# Patient Record
Sex: Female | Born: 1945
Health system: Southern US, Community
[De-identification: ages and names within clinical notes are randomized; demographics above are authoritative.]

## PROBLEM LIST (undated history)

## (undated) DIAGNOSIS — E119 Type 2 diabetes mellitus without complications: Secondary | ICD-10-CM

## (undated) DIAGNOSIS — L821 Other seborrheic keratosis: Secondary | ICD-10-CM

## (undated) DIAGNOSIS — G5600 Carpal tunnel syndrome, unspecified upper limb: Secondary | ICD-10-CM

## (undated) DIAGNOSIS — I10 Essential (primary) hypertension: Secondary | ICD-10-CM

## (undated) DIAGNOSIS — E785 Hyperlipidemia, unspecified: Secondary | ICD-10-CM

## (undated) DIAGNOSIS — L900 Lichen sclerosus et atrophicus: Secondary | ICD-10-CM

## (undated) DIAGNOSIS — L570 Actinic keratosis: Secondary | ICD-10-CM

## (undated) DIAGNOSIS — C4491 Basal cell carcinoma of skin, unspecified: Secondary | ICD-10-CM

## (undated) DIAGNOSIS — H2513 Age-related nuclear cataract, bilateral: Secondary | ICD-10-CM

## (undated) HISTORY — DX: Lichen sclerosus et atrophicus: L90.0

## (undated) HISTORY — DX: Essential (primary) hypertension: I10

## (undated) HISTORY — DX: Age-related nuclear cataract, bilateral: H25.13

## (undated) HISTORY — DX: Actinic keratosis: L57.0

## (undated) HISTORY — DX: Carpal tunnel syndrome, unspecified upper limb: G56.00

## (undated) HISTORY — PX: APPENDECTOMY: SHX54

## (undated) HISTORY — DX: Other seborrheic keratosis: L82.1

## (undated) HISTORY — DX: Basal cell carcinoma of skin, unspecified: C44.91

## (undated) HISTORY — DX: Hyperlipidemia, unspecified: E78.5

## (undated) HISTORY — DX: Type 2 diabetes mellitus without complications: E11.9

---

## 2011-08-22 DIAGNOSIS — R7309 Other abnormal glucose: Secondary | ICD-10-CM | POA: Diagnosis not present

## 2011-08-22 DIAGNOSIS — Z139 Encounter for screening, unspecified: Secondary | ICD-10-CM | POA: Diagnosis not present

## 2011-08-22 DIAGNOSIS — B372 Candidiasis of skin and nail: Secondary | ICD-10-CM | POA: Diagnosis not present

## 2011-08-22 DIAGNOSIS — E785 Hyperlipidemia, unspecified: Secondary | ICD-10-CM | POA: Diagnosis not present

## 2011-08-22 DIAGNOSIS — R030 Elevated blood-pressure reading, without diagnosis of hypertension: Secondary | ICD-10-CM | POA: Diagnosis not present

## 2011-11-30 DIAGNOSIS — Z139 Encounter for screening, unspecified: Secondary | ICD-10-CM | POA: Diagnosis not present

## 2011-11-30 DIAGNOSIS — E785 Hyperlipidemia, unspecified: Secondary | ICD-10-CM | POA: Diagnosis not present

## 2011-11-30 DIAGNOSIS — R7309 Other abnormal glucose: Secondary | ICD-10-CM | POA: Diagnosis not present

## 2011-12-05 DIAGNOSIS — R030 Elevated blood-pressure reading, without diagnosis of hypertension: Secondary | ICD-10-CM | POA: Diagnosis not present

## 2011-12-05 DIAGNOSIS — E669 Obesity, unspecified: Secondary | ICD-10-CM | POA: Diagnosis not present

## 2011-12-05 DIAGNOSIS — E785 Hyperlipidemia, unspecified: Secondary | ICD-10-CM | POA: Diagnosis not present

## 2011-12-05 DIAGNOSIS — E119 Type 2 diabetes mellitus without complications: Secondary | ICD-10-CM | POA: Diagnosis not present

## 2012-01-18 DIAGNOSIS — N760 Acute vaginitis: Secondary | ICD-10-CM | POA: Diagnosis not present

## 2012-01-18 DIAGNOSIS — N390 Urinary tract infection, site not specified: Secondary | ICD-10-CM | POA: Diagnosis not present

## 2012-03-06 DIAGNOSIS — Z23 Encounter for immunization: Secondary | ICD-10-CM | POA: Diagnosis not present

## 2012-03-26 DIAGNOSIS — E119 Type 2 diabetes mellitus without complications: Secondary | ICD-10-CM | POA: Diagnosis not present

## 2012-03-26 DIAGNOSIS — Z23 Encounter for immunization: Secondary | ICD-10-CM | POA: Diagnosis not present

## 2012-03-26 DIAGNOSIS — Z86010 Personal history of colonic polyps: Secondary | ICD-10-CM | POA: Diagnosis not present

## 2012-03-26 DIAGNOSIS — I10 Essential (primary) hypertension: Secondary | ICD-10-CM | POA: Diagnosis not present

## 2012-03-26 DIAGNOSIS — E785 Hyperlipidemia, unspecified: Secondary | ICD-10-CM | POA: Diagnosis not present

## 2012-03-26 DIAGNOSIS — Z139 Encounter for screening, unspecified: Secondary | ICD-10-CM | POA: Diagnosis not present

## 2012-04-26 DIAGNOSIS — L570 Actinic keratosis: Secondary | ICD-10-CM | POA: Diagnosis not present

## 2012-04-26 DIAGNOSIS — D239 Other benign neoplasm of skin, unspecified: Secondary | ICD-10-CM | POA: Diagnosis not present

## 2012-04-26 DIAGNOSIS — B079 Viral wart, unspecified: Secondary | ICD-10-CM | POA: Diagnosis not present

## 2012-04-26 DIAGNOSIS — D485 Neoplasm of uncertain behavior of skin: Secondary | ICD-10-CM | POA: Diagnosis not present

## 2012-05-09 DIAGNOSIS — H02839 Dermatochalasis of unspecified eye, unspecified eyelid: Secondary | ICD-10-CM | POA: Diagnosis not present

## 2012-05-09 DIAGNOSIS — E119 Type 2 diabetes mellitus without complications: Secondary | ICD-10-CM | POA: Diagnosis not present

## 2012-05-09 DIAGNOSIS — H2510 Age-related nuclear cataract, unspecified eye: Secondary | ICD-10-CM | POA: Diagnosis not present

## 2012-05-09 DIAGNOSIS — H524 Presbyopia: Secondary | ICD-10-CM | POA: Diagnosis not present

## 2012-06-28 DIAGNOSIS — Z1231 Encounter for screening mammogram for malignant neoplasm of breast: Secondary | ICD-10-CM | POA: Diagnosis not present

## 2012-09-17 DIAGNOSIS — E785 Hyperlipidemia, unspecified: Secondary | ICD-10-CM | POA: Diagnosis not present

## 2012-09-17 DIAGNOSIS — E119 Type 2 diabetes mellitus without complications: Secondary | ICD-10-CM | POA: Diagnosis not present

## 2012-09-17 DIAGNOSIS — Z139 Encounter for screening, unspecified: Secondary | ICD-10-CM | POA: Diagnosis not present

## 2012-09-18 DIAGNOSIS — Z124 Encounter for screening for malignant neoplasm of cervix: Secondary | ICD-10-CM | POA: Diagnosis not present

## 2012-09-18 DIAGNOSIS — Z01419 Encounter for gynecological examination (general) (routine) without abnormal findings: Secondary | ICD-10-CM | POA: Diagnosis not present

## 2012-09-20 DIAGNOSIS — E119 Type 2 diabetes mellitus without complications: Secondary | ICD-10-CM | POA: Diagnosis not present

## 2012-09-20 DIAGNOSIS — Z1211 Encounter for screening for malignant neoplasm of colon: Secondary | ICD-10-CM | POA: Diagnosis not present

## 2012-09-20 DIAGNOSIS — E785 Hyperlipidemia, unspecified: Secondary | ICD-10-CM | POA: Diagnosis not present

## 2012-09-20 DIAGNOSIS — I10 Essential (primary) hypertension: Secondary | ICD-10-CM | POA: Diagnosis not present

## 2012-09-20 DIAGNOSIS — Z86010 Personal history of colonic polyps: Secondary | ICD-10-CM | POA: Diagnosis not present

## 2013-01-01 DIAGNOSIS — Z8371 Family history of colonic polyps: Secondary | ICD-10-CM | POA: Diagnosis not present

## 2013-01-01 DIAGNOSIS — Z860100 Personal history of colon polyps, unspecified: Secondary | ICD-10-CM | POA: Diagnosis not present

## 2013-01-01 DIAGNOSIS — Z800 Family history of malignant neoplasm of digestive organs: Secondary | ICD-10-CM | POA: Diagnosis not present

## 2013-01-01 DIAGNOSIS — Z1211 Encounter for screening for malignant neoplasm of colon: Secondary | ICD-10-CM | POA: Diagnosis not present

## 2013-01-15 DIAGNOSIS — I10 Essential (primary) hypertension: Secondary | ICD-10-CM | POA: Diagnosis not present

## 2013-01-15 DIAGNOSIS — E785 Hyperlipidemia, unspecified: Secondary | ICD-10-CM | POA: Diagnosis not present

## 2013-01-15 DIAGNOSIS — E119 Type 2 diabetes mellitus without complications: Secondary | ICD-10-CM | POA: Diagnosis not present

## 2013-03-04 ENCOUNTER — Encounter: Payer: Self-pay | Admitting: Family Medicine

## 2013-03-04 ENCOUNTER — Ambulatory Visit (INDEPENDENT_AMBULATORY_CARE_PROVIDER_SITE_OTHER): Payer: Medicare Other | Admitting: Family Medicine

## 2013-03-04 VITALS — BP 123/77 | HR 69 | Temp 97.7°F | Ht 63.0 in | Wt 189.0 lb

## 2013-03-04 DIAGNOSIS — E785 Hyperlipidemia, unspecified: Secondary | ICD-10-CM | POA: Insufficient documentation

## 2013-03-04 DIAGNOSIS — H9190 Unspecified hearing loss, unspecified ear: Secondary | ICD-10-CM | POA: Diagnosis not present

## 2013-03-04 DIAGNOSIS — H9192 Unspecified hearing loss, left ear: Secondary | ICD-10-CM

## 2013-03-04 DIAGNOSIS — H9193 Unspecified hearing loss, bilateral: Secondary | ICD-10-CM

## 2013-03-04 NOTE — Progress Notes (Signed)
  Subjective:    Patient ID: Jacqueline Clark, female    DOB: February 25, 1946, 67 y.o.   MRN: [id number]  HPI This 67 y.o. female presents for evaluation of establishment.  She has hx of HOH and needs a referral To ENT.  She has hx of diabetes.  She has been having difficulty with her hearing and she Needs a referral to [facility].   Review of Systems C/o decreased hearing acuity No chest pain, SOB, HA, dizziness, vision change, N/V, diarrhea, constipation, dysuria, urinary urgency or frequency, myalgias, arthralgias or rash.     Objective:   Physical Exam Vital signs noted  Well developed well nourished female.  HEENT - Head atraumatic Normocephalic                Eyes - PERRLA, Conjuctiva - clear Sclera- Clear EOMI                Ears - EAC's Wnl TM's Wnl Gross Hearing decreased bilateral Respiratory - Lungs CTA bilateral Cardiac - RRR S1 and S2 without murmur GI - Abdomen soft Nontender and bowel sounds active x 4 Extremities - No edema. Neuro - Grossly intact.       Assessment & Plan:  Hearing loss, left - Plan: Ambulatory referral to ENT  Hearing decreased, bilateral - Refer to ENT  Other and unspecified hyperlipidemia - Continue simvastatin  Follow up in 3 months  Deatra Canter FNP

## 2013-03-04 NOTE — Patient Instructions (Addendum)
Hypertriglyceridemia  Diet for High blood levels of Triglycerides Most fats in food are triglycerides. Triglycerides in your blood are stored as fat in your body. High levels of triglycerides in your blood may put you at a greater risk for heart disease and stroke.  Normal triglyceride levels are less than 150 mg/dL. Borderline high levels are 150-199 mg/dl. High levels are 200 - 499 mg/dL, and very high triglyceride levels are greater than 500 mg/dL. The decision to treat high triglycerides is generally based on the level. For people with borderline or high triglyceride levels, treatment includes weight loss and exercise. Drugs are recommended for people with very high triglyceride levels. Many people who need treatment for high triglyceride levels have metabolic syndrome. This syndrome is a collection of disorders that often include: insulin resistance, high blood pressure, blood clotting problems, high cholesterol and triglycerides. TESTING PROCEDURE FOR TRIGLYCERIDES  You should not eat 4 hours before getting your triglycerides measured. The normal range of triglycerides is between 10 and 250 milligrams per deciliter (mg/dl). Some people may have extreme levels (1000 or above), but your triglyceride level may be too high if it is above 150 mg/dl, depending on what other risk factors you have for heart disease.  People with high blood triglycerides may also have high blood cholesterol levels. If you have high blood cholesterol as well as high blood triglycerides, your risk for heart disease is probably greater than if you only had high triglycerides. High blood cholesterol is one of the main risk factors for heart disease. CHANGING YOUR DIET  Your weight can affect your blood triglyceride level. If you are more than 20% above your ideal body weight, you may be able to lower your blood triglycerides by losing weight. Eating less and exercising regularly is the best way to combat this. Fat provides more  calories than any other food. The best way to lose weight is to eat less fat. Only 30% of your total calories should come from fat. Less than 7% of your diet should come from saturated fat. A diet low in fat and saturated fat is the same as a diet to decrease blood cholesterol. By eating a diet lower in fat, you may lose weight, lower your blood cholesterol, and lower your blood triglyceride level.  Eating a diet low in fat, especially saturated fat, may also help you lower your blood triglyceride level. Ask your dietitian to help you figure how much fat you can eat based on the number of calories your caregiver has prescribed for you.  Exercise, in addition to helping with weight loss may also help lower triglyceride levels.   Alcohol can increase blood triglycerides. You may need to stop drinking alcoholic beverages.  Too much carbohydrate in your diet may also increase your blood triglycerides. Some complex carbohydrates are necessary in your diet. These may include bread, rice, potatoes, other starchy vegetables and cereals.  Reduce "simple" carbohydrates. These may include pure sugars, candy, honey, and jelly without losing other nutrients. If you have the kind of high blood triglycerides that is affected by the amount of carbohydrates in your diet, you will need to eat less sugar and less high-sugar foods. Your caregiver can help you with this.  Adding 2-4 grams of fish oil (EPA+ DHA) may also help lower triglycerides. Speak with your caregiver before adding any supplements to your regimen. Following the Diet  Maintain your ideal weight. Your caregivers can help you with a diet. Generally, eating less food and getting more   exercise will help you lose weight. Joining a weight control group may also help. Ask your caregivers for a good weight control group in your area.  Eat low-fat foods instead of high-fat foods. This can help you lose weight too.  These foods are lower in fat. Eat MORE of these:    Dried beans, peas, and lentils.  Egg whites.  Low-fat cottage cheese.  Fish.  Lean cuts of meat, such as round, sirloin, rump, and flank (cut extra fat off meat you fix).  Whole grain breads, cereals and pasta.  Skim and nonfat dry milk.  Low-fat yogurt.  Poultry without the skin.  Cheese made with skim or part-skim milk, such as mozzarella, parmesan, farmers', ricotta, or pot cheese. These are higher fat foods. Eat LESS of these:   Whole milk and foods made from whole milk, such as American, blue, cheddar, monterey jack, and swiss cheese  High-fat meats, such as luncheon meats, sausages, knockwurst, bratwurst, hot dogs, ribs, corned beef, ground pork, and regular ground beef.  Fried foods. Limit saturated fats in your diet. Substituting unsaturated fat for saturated fat may decrease your blood triglyceride level. You will need to read package labels to know which products contain saturated fats.  These foods are high in saturated fat. Eat LESS of these:   Fried pork skins.  Whole milk.  Skin and fat from poultry.  Palm oil.  Butter.  Shortening.  Cream cheese.  Bacon.  Margarines and baked goods made from listed oils.  Vegetable shortenings.  Chitterlings.  Fat from meats.  Coconut oil.  Palm kernel oil.  Lard.  Cream.  Sour cream.  Fatback.  Coffee whiteners and non-dairy creamers made with these oils.  Cheese made from whole milk. Use unsaturated fats (both polyunsaturated and monounsaturated) moderately. Remember, even though unsaturated fats are better than saturated fats; you still want a diet low in total fat.  These foods are high in unsaturated fat:   Canola oil.  Sunflower oil.  Mayonnaise.  Almonds.  Peanuts.  Pine nuts.  Margarines made with these oils.  Safflower oil.  Olive oil.  Avocados.  Cashews.  Peanut butter.  Sunflower seeds.  Soybean oil.  Peanut  oil.  Olives.  Pecans.  Walnuts.  Pumpkin seeds. Avoid sugar and other high-sugar foods. This will decrease carbohydrates without decreasing other nutrients. Sugar in your food goes rapidly to your blood. When there is excess sugar in your blood, your liver may use it to make more triglycerides. Sugar also contains calories without other important nutrients.  Eat LESS of these:   Sugar, brown sugar, powdered sugar, jam, jelly, preserves, honey, syrup, molasses, pies, candy, cakes, cookies, frosting, pastries, colas, soft drinks, punches, fruit drinks, and regular gelatin.  Avoid alcohol. Alcohol, even more than sugar, may increase blood triglycerides. In addition, alcohol is high in calories and low in nutrients. Ask for sparkling water, or a diet soft drink instead of an alcoholic beverage. Suggestions for planning and preparing meals   Bake, broil, grill or roast meats instead of frying.  Remove fat from meats and skin from poultry before cooking.  Add spices, herbs, lemon juice or vinegar to vegetables instead of salt, rich sauces or gravies.  Use a non-stick skillet without fat or use no-stick sprays.  Cool and refrigerate stews and broth. Then remove the hardened fat floating on the surface before serving.  Refrigerate meat drippings and skim off fat to make low-fat gravies.  Serve more fish.  Use less butter,   margarine and other high-fat spreads on bread or vegetables.  Use skim or reconstituted non-fat dry milk for cooking.  Cook with low-fat cheeses.  Substitute low-fat yogurt or cottage cheese for all or part of the sour cream in recipes for sauces, dips or congealed salads.  Use half yogurt/half mayonnaise in salad recipes.  Substitute evaporated skim milk for cream. Evaporated skim milk or reconstituted non-fat dry milk can be whipped and substituted for whipped cream in certain recipes.  Choose fresh fruits for dessert instead of high-fat foods such as pies or  cakes. Fruits are naturally low in fat. When Dining Out   Order low-fat appetizers such as fruit or vegetable juice, pasta with vegetables or tomato sauce.  Select clear, rather than cream soups.  Ask that dressings and gravies be served on the side. Then use less of them.  Order foods that are baked, broiled, poached, steamed, stir-fried, or roasted.  Ask for margarine instead of butter, and use only a small amount.  Drink sparkling water, unsweetened tea or coffee, or diet soft drinks instead of alcohol or other sweet beverages. QUESTIONS AND ANSWERS ABOUT OTHER FATS IN THE BLOOD: SATURATED FAT, TRANS FAT, AND CHOLESTEROL What is trans fat? Trans fat is a type of fat that is formed when vegetable oil is hardened through a process called hydrogenation. This process helps makes foods more solid, gives them shape, and prolongs their shelf life. Trans fats are also called hydrogenated or partially hydrogenated oils.  What do saturated fat, trans fat, and cholesterol in foods have to do with heart disease? Saturated fat, trans fat, and cholesterol in the diet all raise the level of LDL "bad" cholesterol in the blood. The higher the LDL cholesterol, the greater the risk for coronary heart disease (CHD). Saturated fat and trans fat raise LDL similarly.  What foods contain saturated fat, trans fat, and cholesterol? High amounts of saturated fat are found in animal products, such as fatty cuts of meat, chicken skin, and full-fat dairy products like butter, whole milk, cream, and cheese, and in tropical vegetable oils such as palm, palm kernel, and coconut oil. Trans fat is found in some of the same foods as saturated fat, such as vegetable shortening, some margarines (especially hard or stick margarine), crackers, cookies, baked goods, fried foods, salad dressings, and other processed foods made with partially hydrogenated vegetable oils. Small amounts of trans fat also occur naturally in some animal  products, such as milk products, beef, and lamb. Foods high in cholesterol include liver, other organ meats, egg yolks, shrimp, and full-fat dairy products. How can I use the new food label to make heart-healthy food choices? Check the Nutrition Facts panel of the food label. Choose foods lower in saturated fat, trans fat, and cholesterol. For saturated fat and cholesterol, you can also use the Percent Daily Value (%DV): 5% DV or less is low, and 20% DV or more is high. (There is no %DV for trans fat.) Use the Nutrition Facts panel to choose foods low in saturated fat and cholesterol, and if the trans fat is not listed, read the ingredients and limit products that list shortening or hydrogenated or partially hydrogenated vegetable oil, which tend to be high in trans fat. POINTS TO REMEMBER:   Discuss your risk for heart disease with your caregivers, and take steps to reduce risk factors.  Change your diet. Choose foods that are low in saturated fat, trans fat, and cholesterol.  Add exercise to your daily routine if   it is not already being done. Participate in physical activity of moderate intensity, like brisk walking, for at least 30 minutes on most, and preferably all days of the week. No time? Break the 30 minutes into three, 10-minute segments during the day.  Stop smoking. If you do smoke, contact your caregiver to discuss ways in which they can help you quit.  Do not use street drugs.  Maintain a normal weight.  Maintain a healthy blood pressure.  Keep up with your blood work for checking the fats in your blood as directed by your caregiver. Document Released: 03/17/2004 Document Revised: 11/29/2011 Document Reviewed: 10/13/2008 [employer] Patient Information 2014 [employer].  

## 2013-04-17 DIAGNOSIS — Z23 Encounter for immunization: Secondary | ICD-10-CM | POA: Diagnosis not present

## 2013-04-29 DIAGNOSIS — H2510 Age-related nuclear cataract, unspecified eye: Secondary | ICD-10-CM | POA: Diagnosis not present

## 2013-04-29 DIAGNOSIS — H5200 Hypermetropia, unspecified eye: Secondary | ICD-10-CM | POA: Diagnosis not present

## 2013-04-29 DIAGNOSIS — H524 Presbyopia: Secondary | ICD-10-CM | POA: Diagnosis not present

## 2013-04-29 DIAGNOSIS — H0230 Blepharochalasis unspecified eye, unspecified eyelid: Secondary | ICD-10-CM | POA: Diagnosis not present

## 2013-05-27 ENCOUNTER — Ambulatory Visit (INDEPENDENT_AMBULATORY_CARE_PROVIDER_SITE_OTHER): Payer: Medicare Other | Admitting: Family Medicine

## 2013-05-27 ENCOUNTER — Encounter: Payer: Self-pay | Admitting: Family Medicine

## 2013-05-27 VITALS — BP 123/78 | HR 66 | Temp 96.6°F | Ht 63.0 in | Wt 192.0 lb

## 2013-05-27 DIAGNOSIS — E785 Hyperlipidemia, unspecified: Secondary | ICD-10-CM | POA: Diagnosis not present

## 2013-05-27 MED ORDER — SIMVASTATIN 20 MG PO TABS: 20.0000 mg | ORAL_TABLET | Freq: Every evening | ORAL | Status: DC

## 2013-05-27 NOTE — Progress Notes (Signed)
   Subjective:    Patient ID: Jacqueline Clark, female    DOB: [birth year], 67 y.o.   MRN: [id number]  HPI This 67 y.o. female presents for evaluation of needing refills on her lipid medicine.   Review of Systems No chest pain, SOB, HA, dizziness, vision change, N/V, diarrhea, constipation, dysuria, urinary urgency or frequency, myalgias, arthralgias or rash.     Objective:   Physical Exam  Vital signs noted  Well developed well nourished female.  HEENT - Head atraumatic Normocephalic                Eyes - PERRLA, Conjuctiva - clear Sclera- Clear EOMI                Ears - EAC's Wnl TM's Wnl Gross Hearing WNL                Throat - oropharanx wnl Respiratory - Lungs CTA bilateral Cardiac - RRR S1 and S2 without murmur GI - Abdomen soft Nontender and bowel sounds active x 4 Extremities - No edema. Neuro - Grossly intact.      Assessment & Plan:  Other and unspecified hyperlipidemia - Plan: simvastatin (ZOCOR) 20 MG tablet  Deatra Canter FNP

## 2013-05-27 NOTE — Patient Instructions (Signed)
Hypertriglyceridemia  Diet for High blood levels of Triglycerides Most fats in food are triglycerides. Triglycerides in your blood are stored as fat in your body. High levels of triglycerides in your blood may put you at a greater risk for heart disease and stroke.  Normal triglyceride levels are less than 150 mg/dL. Borderline high levels are 150-199 mg/dl. High levels are 200 - 499 mg/dL, and very high triglyceride levels are greater than 500 mg/dL. The decision to treat high triglycerides is generally based on the level. For people with borderline or high triglyceride levels, treatment includes weight loss and exercise. Drugs are recommended for people with very high triglyceride levels. Many people who need treatment for high triglyceride levels have metabolic syndrome. This syndrome is a collection of disorders that often include: insulin resistance, high blood pressure, blood clotting problems, high cholesterol and triglycerides. TESTING PROCEDURE FOR TRIGLYCERIDES  You should not eat 4 hours before getting your triglycerides measured. The normal range of triglycerides is between 10 and 250 milligrams per deciliter (mg/dl). Some people may have extreme levels (1000 or above), but your triglyceride level may be too high if it is above 150 mg/dl, depending on what other risk factors you have for heart disease.  People with high blood triglycerides may also have high blood cholesterol levels. If you have high blood cholesterol as well as high blood triglycerides, your risk for heart disease is probably greater than if you only had high triglycerides. High blood cholesterol is one of the main risk factors for heart disease. CHANGING YOUR DIET  Your weight can affect your blood triglyceride level. If you are more than 20% above your ideal body weight, you may be able to lower your blood triglycerides by losing weight. Eating less and exercising regularly is the best way to combat this. Fat provides more  calories than any other food. The best way to lose weight is to eat less fat. Only 30% of your total calories should come from fat. Less than 7% of your diet should come from saturated fat. A diet low in fat and saturated fat is the same as a diet to decrease blood cholesterol. By eating a diet lower in fat, you may lose weight, lower your blood cholesterol, and lower your blood triglyceride level.  Eating a diet low in fat, especially saturated fat, may also help you lower your blood triglyceride level. Ask your dietitian to help you figure how much fat you can eat based on the number of calories your caregiver has prescribed for you.  Exercise, in addition to helping with weight loss may also help lower triglyceride levels.   Alcohol can increase blood triglycerides. You may need to stop drinking alcoholic beverages.  Too much carbohydrate in your diet may also increase your blood triglycerides. Some complex carbohydrates are necessary in your diet. These may include bread, rice, potatoes, other starchy vegetables and cereals.  Reduce "simple" carbohydrates. These may include pure sugars, candy, honey, and jelly without losing other nutrients. If you have the kind of high blood triglycerides that is affected by the amount of carbohydrates in your diet, you will need to eat less sugar and less high-sugar foods. Your caregiver can help you with this.  Adding 2-4 grams of fish oil (EPA+ DHA) may also help lower triglycerides. Speak with your caregiver before adding any supplements to your regimen. Following the Diet  Maintain your ideal weight. Your caregivers can help you with a diet. Generally, eating less food and getting more   exercise will help you lose weight. Joining a weight control group may also help. Ask your caregivers for a good weight control group in your area.  Eat low-fat foods instead of high-fat foods. This can help you lose weight too.  These foods are lower in fat. Eat MORE of these:    Dried beans, peas, and lentils.  Egg whites.  Low-fat cottage cheese.  Fish.  Lean cuts of meat, such as round, sirloin, rump, and flank (cut extra fat off meat you fix).  Whole grain breads, cereals and pasta.  Skim and nonfat dry milk.  Low-fat yogurt.  Poultry without the skin.  Cheese made with skim or part-skim milk, such as mozzarella, parmesan, farmers', ricotta, or pot cheese. These are higher fat foods. Eat LESS of these:   Whole milk and foods made from whole milk, such as American, blue, cheddar, monterey jack, and swiss cheese  High-fat meats, such as luncheon meats, sausages, knockwurst, bratwurst, hot dogs, ribs, corned beef, ground pork, and regular ground beef.  Fried foods. Limit saturated fats in your diet. Substituting unsaturated fat for saturated fat may decrease your blood triglyceride level. You will need to read package labels to know which products contain saturated fats.  These foods are high in saturated fat. Eat LESS of these:   Fried pork skins.  Whole milk.  Skin and fat from poultry.  Palm oil.  Butter.  Shortening.  Cream cheese.  Bacon.  Margarines and baked goods made from listed oils.  Vegetable shortenings.  Chitterlings.  Fat from meats.  Coconut oil.  Palm kernel oil.  Lard.  Cream.  Sour cream.  Fatback.  Coffee whiteners and non-dairy creamers made with these oils.  Cheese made from whole milk. Use unsaturated fats (both polyunsaturated and monounsaturated) moderately. Remember, even though unsaturated fats are better than saturated fats; you still want a diet low in total fat.  These foods are high in unsaturated fat:   Canola oil.  Sunflower oil.  Mayonnaise.  Almonds.  Peanuts.  Pine nuts.  Margarines made with these oils.  Safflower oil.  Olive oil.  Avocados.  Cashews.  Peanut butter.  Sunflower seeds.  Soybean oil.  Peanut  oil.  Olives.  Pecans.  Walnuts.  Pumpkin seeds. Avoid sugar and other high-sugar foods. This will decrease carbohydrates without decreasing other nutrients. Sugar in your food goes rapidly to your blood. When there is excess sugar in your blood, your liver may use it to make more triglycerides. Sugar also contains calories without other important nutrients.  Eat LESS of these:   Sugar, brown sugar, powdered sugar, jam, jelly, preserves, honey, syrup, molasses, pies, candy, cakes, cookies, frosting, pastries, colas, soft drinks, punches, fruit drinks, and regular gelatin.  Avoid alcohol. Alcohol, even more than sugar, may increase blood triglycerides. In addition, alcohol is high in calories and low in nutrients. Ask for sparkling water, or a diet soft drink instead of an alcoholic beverage. Suggestions for planning and preparing meals   Bake, broil, grill or roast meats instead of frying.  Remove fat from meats and skin from poultry before cooking.  Add spices, herbs, lemon juice or vinegar to vegetables instead of salt, rich sauces or gravies.  Use a non-stick skillet without fat or use no-stick sprays.  Cool and refrigerate stews and broth. Then remove the hardened fat floating on the surface before serving.  Refrigerate meat drippings and skim off fat to make low-fat gravies.  Serve more fish.  Use less butter,   margarine and other high-fat spreads on bread or vegetables.  Use skim or reconstituted non-fat dry milk for cooking.  Cook with low-fat cheeses.  Substitute low-fat yogurt or cottage cheese for all or part of the sour cream in recipes for sauces, dips or congealed salads.  Use half yogurt/half mayonnaise in salad recipes.  Substitute evaporated skim milk for cream. Evaporated skim milk or reconstituted non-fat dry milk can be whipped and substituted for whipped cream in certain recipes.  Choose fresh fruits for dessert instead of high-fat foods such as pies or  cakes. Fruits are naturally low in fat. When Dining Out   Order low-fat appetizers such as fruit or vegetable juice, pasta with vegetables or tomato sauce.  Select clear, rather than cream soups.  Ask that dressings and gravies be served on the side. Then use less of them.  Order foods that are baked, broiled, poached, steamed, stir-fried, or roasted.  Ask for margarine instead of butter, and use only a small amount.  Drink sparkling water, unsweetened tea or coffee, or diet soft drinks instead of alcohol or other sweet beverages. QUESTIONS AND ANSWERS ABOUT OTHER FATS IN THE BLOOD: SATURATED FAT, TRANS FAT, AND CHOLESTEROL What is trans fat? Trans fat is a type of fat that is formed when vegetable oil is hardened through a process called hydrogenation. This process helps makes foods more solid, gives them shape, and prolongs their shelf life. Trans fats are also called hydrogenated or partially hydrogenated oils.  What do saturated fat, trans fat, and cholesterol in foods have to do with heart disease? Saturated fat, trans fat, and cholesterol in the diet all raise the level of LDL "bad" cholesterol in the blood. The higher the LDL cholesterol, the greater the risk for coronary heart disease (CHD). Saturated fat and trans fat raise LDL similarly.  What foods contain saturated fat, trans fat, and cholesterol? High amounts of saturated fat are found in animal products, such as fatty cuts of meat, chicken skin, and full-fat dairy products like butter, whole milk, cream, and cheese, and in tropical vegetable oils such as palm, palm kernel, and coconut oil. Trans fat is found in some of the same foods as saturated fat, such as vegetable shortening, some margarines (especially hard or stick margarine), crackers, cookies, baked goods, fried foods, salad dressings, and other processed foods made with partially hydrogenated vegetable oils. Small amounts of trans fat also occur naturally in some animal  products, such as milk products, beef, and lamb. Foods high in cholesterol include liver, other organ meats, egg yolks, shrimp, and full-fat dairy products. How can I use the new food label to make heart-healthy food choices? Check the Nutrition Facts panel of the food label. Choose foods lower in saturated fat, trans fat, and cholesterol. For saturated fat and cholesterol, you can also use the Percent Daily Value (%DV): 5% DV or less is low, and 20% DV or more is high. (There is no %DV for trans fat.) Use the Nutrition Facts panel to choose foods low in saturated fat and cholesterol, and if the trans fat is not listed, read the ingredients and limit products that list shortening or hydrogenated or partially hydrogenated vegetable oil, which tend to be high in trans fat. POINTS TO REMEMBER:   Discuss your risk for heart disease with your caregivers, and take steps to reduce risk factors.  Change your diet. Choose foods that are low in saturated fat, trans fat, and cholesterol.  Add exercise to your daily routine if   it is not already being done. Participate in physical activity of moderate intensity, like brisk walking, for at least 30 minutes on most, and preferably all days of the week. No time? Break the 30 minutes into three, 10-minute segments during the day.  Stop smoking. If you do smoke, contact your caregiver to discuss ways in which they can help you quit.  Do not use street drugs.  Maintain a normal weight.  Maintain a healthy blood pressure.  Keep up with your blood work for checking the fats in your blood as directed by your caregiver. Document Released: 03/17/2004 Document Revised: 11/29/2011 Document Reviewed: 10/13/2008 Exit[employer] Patient Information 2014 [employer].  

## 2013-08-23 ENCOUNTER — Ambulatory Visit (INDEPENDENT_AMBULATORY_CARE_PROVIDER_SITE_OTHER): Payer: Medicare Other | Admitting: *Deleted

## 2013-08-23 DIAGNOSIS — Z111 Encounter for screening for respiratory tuberculosis: Secondary | ICD-10-CM | POA: Diagnosis not present

## 2013-08-23 NOTE — Progress Notes (Signed)
Ppd placed and tolerated well

## 2013-08-23 NOTE — Patient Instructions (Signed)

## 2013-08-26 ENCOUNTER — Ambulatory Visit: Payer: Self-pay | Admitting: Family Medicine

## 2013-08-26 ENCOUNTER — Encounter (INDEPENDENT_AMBULATORY_CARE_PROVIDER_SITE_OTHER): Payer: Self-pay

## 2013-08-26 VITALS — BP 125/69 | HR 68 | Temp 96.2°F | Ht 63.0 in | Wt 186.2 lb

## 2013-08-26 DIAGNOSIS — Z021 Encounter for pre-employment examination: Secondary | ICD-10-CM

## 2013-08-26 DIAGNOSIS — Z0289 Encounter for other administrative examinations: Secondary | ICD-10-CM | POA: Diagnosis not present

## 2013-08-26 LAB — TB SKIN TEST
INDURATION: 0 mm
TB SKIN TEST: NEGATIVE

## 2013-08-26 NOTE — Progress Notes (Signed)
   Subjective:    Patient ID: Jacqueline Clark, female    DOB: August 05, 1945, 68 y.o.   MRN: [id number]  HPI This 68 y.o. 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   Review of Systems No chest pain, SOB, HA, dizziness, vision change, N/V, diarrhea, constipation, dysuria, urinary urgency or frequency, myalgias, arthralgias or rash.     Objective:   Physical Exam  Vital signs noted  Well developed well nourished female.  HEENT - Head atraumatic Normocephalic                Eyes - PERRLA, Conjuctiva - clear Sclera- Clear EOMI                Ears - EAC's Wnl TM's Wnl Gross Hearing WNL                 Throat - oropharanx wnl Respiratory - Lungs CTA bilateral Cardiac - RRR S1 and S2 without murmur GI - Abdomen soft Nontender and bowel sounds active x 4 Extremities - No edema.      Assessment & Plan:  Pre-employment examination Form filled out and patient has negative TBST  Jacqueline Penner FNP

## 2013-08-30 ENCOUNTER — Telehealth: Payer: Self-pay | Admitting: Family Medicine

## 2013-08-30 DIAGNOSIS — Z1331 Encounter for screening for depression: Secondary | ICD-10-CM | POA: Diagnosis not present

## 2013-08-30 DIAGNOSIS — L538 Other specified erythematous conditions: Secondary | ICD-10-CM | POA: Diagnosis not present

## 2013-08-30 DIAGNOSIS — Z5181 Encounter for therapeutic drug level monitoring: Secondary | ICD-10-CM | POA: Diagnosis not present

## 2013-08-30 DIAGNOSIS — E785 Hyperlipidemia, unspecified: Secondary | ICD-10-CM | POA: Diagnosis not present

## 2013-08-30 DIAGNOSIS — R7309 Other abnormal glucose: Secondary | ICD-10-CM | POA: Diagnosis not present

## 2013-11-12 DIAGNOSIS — L570 Actinic keratosis: Secondary | ICD-10-CM | POA: Diagnosis not present

## 2013-11-12 DIAGNOSIS — L821 Other seborrheic keratosis: Secondary | ICD-10-CM | POA: Diagnosis not present

## 2013-11-12 DIAGNOSIS — D239 Other benign neoplasm of skin, unspecified: Secondary | ICD-10-CM | POA: Diagnosis not present

## 2013-11-12 DIAGNOSIS — D485 Neoplasm of uncertain behavior of skin: Secondary | ICD-10-CM | POA: Diagnosis not present

## 2013-11-12 DIAGNOSIS — Z85828 Personal history of other malignant neoplasm of skin: Secondary | ICD-10-CM | POA: Diagnosis not present

## 2013-11-14 DIAGNOSIS — C44721 Squamous cell carcinoma of skin of unspecified lower limb, including hip: Secondary | ICD-10-CM | POA: Diagnosis not present

## 2013-11-25 DIAGNOSIS — R7309 Other abnormal glucose: Secondary | ICD-10-CM | POA: Diagnosis not present

## 2013-11-28 DIAGNOSIS — E119 Type 2 diabetes mellitus without complications: Secondary | ICD-10-CM | POA: Diagnosis not present

## 2013-12-02 DIAGNOSIS — C44721 Squamous cell carcinoma of skin of unspecified lower limb, including hip: Secondary | ICD-10-CM | POA: Diagnosis not present

## 2014-02-13 DIAGNOSIS — E785 Hyperlipidemia, unspecified: Secondary | ICD-10-CM | POA: Diagnosis not present

## 2014-02-13 DIAGNOSIS — E119 Type 2 diabetes mellitus without complications: Secondary | ICD-10-CM | POA: Diagnosis not present

## 2014-03-25 DIAGNOSIS — Z23 Encounter for immunization: Secondary | ICD-10-CM | POA: Diagnosis not present

## 2014-07-24 DIAGNOSIS — H2513 Age-related nuclear cataract, bilateral: Secondary | ICD-10-CM | POA: Diagnosis not present

## 2014-08-22 DIAGNOSIS — Z01419 Encounter for gynecological examination (general) (routine) without abnormal findings: Secondary | ICD-10-CM | POA: Diagnosis not present

## 2014-08-22 DIAGNOSIS — L904 Acrodermatitis chronica atrophicans: Secondary | ICD-10-CM | POA: Diagnosis not present

## 2014-08-22 DIAGNOSIS — N390 Urinary tract infection, site not specified: Secondary | ICD-10-CM | POA: Diagnosis not present

## 2014-08-22 DIAGNOSIS — Z6833 Body mass index (BMI) 33.0-33.9, adult: Secondary | ICD-10-CM | POA: Diagnosis not present

## 2014-08-22 DIAGNOSIS — N760 Acute vaginitis: Secondary | ICD-10-CM | POA: Diagnosis not present

## 2014-09-10 DIAGNOSIS — Z1231 Encounter for screening mammogram for malignant neoplasm of breast: Secondary | ICD-10-CM | POA: Diagnosis not present

## 2014-09-10 DIAGNOSIS — N958 Other specified menopausal and perimenopausal disorders: Secondary | ICD-10-CM | POA: Diagnosis not present

## 2014-09-10 DIAGNOSIS — L904 Acrodermatitis chronica atrophicans: Secondary | ICD-10-CM | POA: Diagnosis not present

## 2014-09-10 LAB — HM DEXA SCAN

## 2014-11-07 DIAGNOSIS — L904 Acrodermatitis chronica atrophicans: Secondary | ICD-10-CM | POA: Diagnosis not present

## 2014-11-20 ENCOUNTER — Ambulatory Visit: Payer: Self-pay | Admitting: Internal Medicine

## 2014-11-25 ENCOUNTER — Encounter: Payer: Self-pay | Admitting: Obstetrics & Gynecology

## 2014-11-25 ENCOUNTER — Ambulatory Visit (INDEPENDENT_AMBULATORY_CARE_PROVIDER_SITE_OTHER): Payer: Medicare Other | Admitting: Obstetrics & Gynecology

## 2014-11-25 VITALS — BP 147/86 | HR 81 | Resp 16 | Ht 63.0 in | Wt 183.0 lb

## 2014-11-25 DIAGNOSIS — L900 Lichen sclerosus et atrophicus: Secondary | ICD-10-CM | POA: Diagnosis not present

## 2014-11-25 MED ORDER — CLOBETASOL PROPIONATE 0.05 % EX OINT: TOPICAL_OINTMENT | CUTANEOUS | Status: DC

## 2014-11-25 MED ORDER — ESTRADIOL 0.1 MG/GM VA CREA: TOPICAL_CREAM | VAGINAL | Status: DC

## 2014-11-25 NOTE — Progress Notes (Signed)
   Subjective:    Patient ID: Jacqueline Clark, female    DOB: [birth year], 69 y.o.   MRN: [id number]  HPI  69 yo WW P2 (2 grands) here today to have me manage her lichen sclerosus. She has used temovate BID for 4 months and started vaginal estrogen about 2 weeks, three times per week. Her vulvar itching resolved.  Review of Systems She is originally from [place]. She relocated here to be with her daughter and son in law. She is UTD on colonoscopy, mammogram, and DEXA. Dr. Concha Se is her physician.    Objective:   Physical Exam        Assessment & Plan:

## 2014-11-27 DIAGNOSIS — K279 Peptic ulcer, site unspecified, unspecified as acute or chronic, without hemorrhage or perforation: Secondary | ICD-10-CM | POA: Diagnosis not present

## 2014-11-27 DIAGNOSIS — E785 Hyperlipidemia, unspecified: Secondary | ICD-10-CM | POA: Diagnosis not present

## 2014-11-27 DIAGNOSIS — Z1321 Encounter for screening for nutritional disorder: Secondary | ICD-10-CM | POA: Diagnosis not present

## 2014-11-27 DIAGNOSIS — E119 Type 2 diabetes mellitus without complications: Secondary | ICD-10-CM | POA: Diagnosis not present

## 2014-11-27 DIAGNOSIS — E669 Obesity, unspecified: Secondary | ICD-10-CM | POA: Diagnosis not present

## 2014-11-27 DIAGNOSIS — I10 Essential (primary) hypertension: Secondary | ICD-10-CM | POA: Diagnosis not present

## 2014-12-05 DIAGNOSIS — M545 Low back pain: Secondary | ICD-10-CM | POA: Diagnosis not present

## 2014-12-05 DIAGNOSIS — Z23 Encounter for immunization: Secondary | ICD-10-CM | POA: Diagnosis not present

## 2015-01-13 DIAGNOSIS — E785 Hyperlipidemia, unspecified: Secondary | ICD-10-CM | POA: Diagnosis not present

## 2015-01-13 DIAGNOSIS — Z79899 Other long term (current) drug therapy: Secondary | ICD-10-CM | POA: Diagnosis not present

## 2015-01-13 DIAGNOSIS — E119 Type 2 diabetes mellitus without complications: Secondary | ICD-10-CM | POA: Diagnosis not present

## 2015-01-23 ENCOUNTER — Other Ambulatory Visit: Payer: Self-pay | Admitting: Physician Assistant

## 2015-01-23 DIAGNOSIS — L570 Actinic keratosis: Secondary | ICD-10-CM | POA: Diagnosis not present

## 2015-01-23 DIAGNOSIS — D0439 Carcinoma in situ of skin of other parts of face: Secondary | ICD-10-CM | POA: Diagnosis not present

## 2015-01-23 DIAGNOSIS — D0430 Carcinoma in situ of skin of unspecified part of face: Secondary | ICD-10-CM | POA: Diagnosis not present

## 2015-01-23 DIAGNOSIS — D225 Melanocytic nevi of trunk: Secondary | ICD-10-CM | POA: Diagnosis not present

## 2015-01-23 DIAGNOSIS — L814 Other melanin hyperpigmentation: Secondary | ICD-10-CM | POA: Diagnosis not present

## 2015-01-23 DIAGNOSIS — D485 Neoplasm of uncertain behavior of skin: Secondary | ICD-10-CM | POA: Diagnosis not present

## 2015-02-19 DIAGNOSIS — D485 Neoplasm of uncertain behavior of skin: Secondary | ICD-10-CM | POA: Diagnosis not present

## 2015-02-19 DIAGNOSIS — D0430 Carcinoma in situ of skin of unspecified part of face: Secondary | ICD-10-CM | POA: Diagnosis not present

## 2015-02-19 DIAGNOSIS — L905 Scar conditions and fibrosis of skin: Secondary | ICD-10-CM | POA: Diagnosis not present

## 2015-02-26 ENCOUNTER — Encounter: Payer: Self-pay | Admitting: Obstetrics & Gynecology

## 2015-02-26 ENCOUNTER — Ambulatory Visit (INDEPENDENT_AMBULATORY_CARE_PROVIDER_SITE_OTHER): Payer: Medicare Other | Admitting: Obstetrics & Gynecology

## 2015-02-26 VITALS — BP 135/75 | HR 90 | Wt 183.0 lb

## 2015-02-26 DIAGNOSIS — L900 Lichen sclerosus et atrophicus: Secondary | ICD-10-CM | POA: Diagnosis not present

## 2015-02-26 MED ORDER — NYSTATIN 100000 UNIT/GM EX CREA: 1.0000 "application " | TOPICAL_CREAM | Freq: Two times a day (BID) | CUTANEOUS | Status: DC

## 2015-02-26 MED ORDER — ESTRADIOL 0.1 MG/GM VA CREA: TOPICAL_CREAM | VAGINAL | Status: DC

## 2015-02-26 NOTE — Progress Notes (Signed)
   Subjective:    Patient ID: Jacqueline Clark, female    DOB: Sep 17, 1945, 69 y.o.   MRN: [id number]  HPI  She is here for follow up of her vulvar itching/lichen sclerosus. She is using estrace cream 2 days per a week and clobetasol 5 days a week. She doesn't feel any improvement.  Review of Systems     Objective:   Physical Exam [id number] Breathing, conversing, and ambulating normally Abd- benign EG- no improvement, still very atrophic       Assessment & Plan:  Lichen sclerosus and symptomatic vulvo vaginal atrophy. I had her demonstrate how much estrogen cream she is using. It was a painfully small (would fill a tooth paste lid) amount. I have recommended that she double or triple her usual amount. RTC 4-6 months

## 2015-02-26 NOTE — Progress Notes (Signed)
Patient would like it noted to not have any vaginal/cervical screening done until 08-21-2016 for insurance purposes. Kathrene Alu RN BSN

## 2015-03-02 ENCOUNTER — Telehealth: Payer: Self-pay | Admitting: Family Medicine

## 2015-03-06 ENCOUNTER — Telehealth: Payer: Self-pay | Admitting: *Deleted

## 2015-03-06 DIAGNOSIS — N952 Postmenopausal atrophic vaginitis: Secondary | ICD-10-CM

## 2015-03-06 DIAGNOSIS — L900 Lichen sclerosus et atrophicus: Secondary | ICD-10-CM

## 2015-03-06 MED ORDER — ESTRADIOL 0.1 MG/GM VA CREA: TOPICAL_CREAM | VAGINAL | Status: DC

## 2015-03-06 MED ORDER — CLOBETASOL PROPIONATE 0.05 % EX OINT: TOPICAL_OINTMENT | CUTANEOUS | Status: DC

## 2015-03-06 NOTE — Telephone Encounter (Signed)
RX  For Estrace cream and Clobetasol  cream sent to pharmacy via [place].  She called stating that it was not at the pharmacy.

## 2015-04-09 DIAGNOSIS — E119 Type 2 diabetes mellitus without complications: Secondary | ICD-10-CM | POA: Diagnosis not present

## 2015-04-09 DIAGNOSIS — E785 Hyperlipidemia, unspecified: Secondary | ICD-10-CM | POA: Diagnosis not present

## 2015-04-15 DIAGNOSIS — E785 Hyperlipidemia, unspecified: Secondary | ICD-10-CM | POA: Diagnosis not present

## 2015-04-15 DIAGNOSIS — K279 Peptic ulcer, site unspecified, unspecified as acute or chronic, without hemorrhage or perforation: Secondary | ICD-10-CM | POA: Diagnosis not present

## 2015-04-15 DIAGNOSIS — E669 Obesity, unspecified: Secondary | ICD-10-CM | POA: Diagnosis not present

## 2015-04-15 DIAGNOSIS — I10 Essential (primary) hypertension: Secondary | ICD-10-CM | POA: Diagnosis not present

## 2015-04-15 DIAGNOSIS — E119 Type 2 diabetes mellitus without complications: Secondary | ICD-10-CM | POA: Diagnosis not present

## 2015-05-28 DIAGNOSIS — D239 Other benign neoplasm of skin, unspecified: Secondary | ICD-10-CM | POA: Diagnosis not present

## 2015-05-28 DIAGNOSIS — E785 Hyperlipidemia, unspecified: Secondary | ICD-10-CM | POA: Diagnosis not present

## 2015-06-17 ENCOUNTER — Ambulatory Visit (INDEPENDENT_AMBULATORY_CARE_PROVIDER_SITE_OTHER): Payer: Medicare Other | Admitting: Obstetrics & Gynecology

## 2015-06-17 ENCOUNTER — Encounter: Payer: Self-pay | Admitting: Obstetrics & Gynecology

## 2015-06-17 VITALS — BP 132/71 | HR 73 | Resp 16 | Ht 63.0 in | Wt 187.0 lb

## 2015-06-17 DIAGNOSIS — L900 Lichen sclerosus et atrophicus: Secondary | ICD-10-CM | POA: Diagnosis not present

## 2015-06-18 NOTE — Progress Notes (Signed)
   Subjective:    Patient ID: Jacqueline Clark, female    DOB: 30-Sep-1945, 70 y.o.   MRN: [id number]  HPI  Pt presents for follow up of lichen sclerosis.  The itching is better.  She does not have pain.  She is not sexually active.  She uses clobetasol qod and estrace cream qod fo intoritus.  Not around anus or perineum.  Review of Systems  Constitutional: Negative.   Gastrointestinal: Negative.   Genitourinary: Negative.        Objective:   Physical Exam  Constitutional: She appears well-developed and well-nourished.  Pulmonary/Chest: Effort normal.  Abdominal: Soft. There is no tenderness.  Genitourinary:     Vitals reviewed.         Assessment & Plan:  70 yo female with lichen sclerosis that has improved in areas where creams arep laced, but creams need to applied to perinuem and perianal.  1-Clobetasol--qod for one month, 2x week for 1 month.   2-Estrogen cream--continue qod for 2 months.  When she returns in 2 months, will try weaning the estrace cream.

## 2015-07-30 DIAGNOSIS — Z23 Encounter for immunization: Secondary | ICD-10-CM | POA: Diagnosis not present

## 2015-08-13 ENCOUNTER — Encounter: Payer: Self-pay | Admitting: Obstetrics & Gynecology

## 2015-08-13 ENCOUNTER — Ambulatory Visit (INDEPENDENT_AMBULATORY_CARE_PROVIDER_SITE_OTHER): Payer: Medicare Other | Admitting: Obstetrics & Gynecology

## 2015-08-13 VITALS — BP 134/76 | HR 92 | Resp 16 | Ht 63.0 in | Wt 190.0 lb

## 2015-08-13 DIAGNOSIS — L900 Lichen sclerosus et atrophicus: Secondary | ICD-10-CM | POA: Diagnosis not present

## 2015-08-16 NOTE — Progress Notes (Signed)
   Subjective:    Patient ID: Jacqueline Clark, female    DOB: Aug 21, 1945, 70 y.o.   MRN: [id number]  HPI  70 yo female presentf for follow up of lichen sclerosis.  Pt has been alternating clobetasol and estrogen creams every other day.  However, she has been placing the creams twice a day for every other day.  Review of Systems  Constitutional: Negative.   Gastrointestinal: Negative.   Genitourinary: Negative for vaginal bleeding, vaginal discharge, vaginal pain and pelvic pain.       Objective:   Physical Exam  Constitutional: She is oriented to person, place, and time. She appears well-developed and well-nourished. No distress.  HENT:  Head: Normocephalic and atraumatic.  Eyes: Conjunctivae are normal.  Pulmonary/Chest: Effort normal.  Abdominal: Soft. Bowel sounds are normal. There is no tenderness.  Genitourinary:     Neurological: She is alert and oriented to person, place, and time.  Skin: Skin is warm and dry.  Psychiatric: She has a normal mood and affect.  Vitals reviewed.         Assessment & Plan:  Lichen sclerosis is improving.    Stop the estrogen as it was only going at the introitus. Continue Clobetasol once a day and EVERY OTHER DAY RN Carlis Abbott drew picture and wrote instructions down for patient.  Consider using [employer] to photograph next visit.  Pt verbalizes understanding. RTC 1 month

## 2015-09-03 DIAGNOSIS — H2513 Age-related nuclear cataract, bilateral: Secondary | ICD-10-CM | POA: Diagnosis not present

## 2015-09-17 ENCOUNTER — Ambulatory Visit (INDEPENDENT_AMBULATORY_CARE_PROVIDER_SITE_OTHER): Payer: Medicare Other | Admitting: Obstetrics & Gynecology

## 2015-09-17 ENCOUNTER — Encounter: Payer: Self-pay | Admitting: Obstetrics & Gynecology

## 2015-09-17 VITALS — BP 138/85 | HR 78 | Ht 62.0 in | Wt 189.0 lb

## 2015-09-17 DIAGNOSIS — L900 Lichen sclerosus et atrophicus: Secondary | ICD-10-CM | POA: Diagnosis not present

## 2015-09-17 NOTE — Progress Notes (Signed)
   Subjective:    Patient ID: Jacqueline Clark, female    DOB: August 17, 1945, 70 y.o.   MRN: [id number]  HPI  59 female present for f/u of lichen sclerosis.  Pt denies burning, itching, and pain.  She looked once with mirror and thought it looked improved.  She is applying the clobetasol one application every other day.  She is not using the estrogen cream.  Review of Systems  Constitutional: Negative.   Gastrointestinal: Negative.   Genitourinary: Negative.        Objective:   Physical Exam  Constitutional: She appears well-developed and well-nourished. No distress.  Genitourinary:             Assessment & Plan:  70 yo female with lichen sclerosis, improving.  1-continue every other day clobetasol for 2 more months 2-RTC in 2 months

## 2015-09-17 NOTE — Addendum Note (Signed)
Addended by: Phill Myron on: 09/17/2015 03:35 PM   Modules accepted: Medications

## 2015-11-30 ENCOUNTER — Ambulatory Visit: Payer: Medicare Other | Admitting: Obstetrics & Gynecology

## 2015-12-08 ENCOUNTER — Encounter: Payer: Self-pay | Admitting: Obstetrics & Gynecology

## 2015-12-08 ENCOUNTER — Ambulatory Visit (INDEPENDENT_AMBULATORY_CARE_PROVIDER_SITE_OTHER): Payer: Medicare Other | Admitting: Obstetrics & Gynecology

## 2015-12-08 VITALS — BP 104/65 | HR 75 | Resp 16 | Ht 62.0 in | Wt 184.0 lb

## 2015-12-08 DIAGNOSIS — N644 Mastodynia: Secondary | ICD-10-CM | POA: Diagnosis not present

## 2015-12-08 DIAGNOSIS — L900 Lichen sclerosus et atrophicus: Secondary | ICD-10-CM

## 2015-12-08 DIAGNOSIS — Z0000 Encounter for general adult medical examination without abnormal findings: Secondary | ICD-10-CM | POA: Diagnosis not present

## 2015-12-08 MED ORDER — CLOBETASOL PROPIONATE 0.05 % EX OINT: TOPICAL_OINTMENT | CUTANEOUS | Status: DC

## 2015-12-08 NOTE — Progress Notes (Signed)
   Subjective:    Patient ID: Jacqueline Clark, female    DOB: March 04, 1946, 70 y.o.   MRN: [id number]  HPI  Pt presents for surveillance of lichen sclerosis.  Pt is asymptomatic on every other day application of clobetasol.  Pt denies any vaginal bleeding, itching, or pain.  Review of Systems  Genitourinary: Negative for vaginal pain and menstrual problem.       Objective:   Physical Exam  Pulmonary/Chest: Effort normal.  Abdominal: Soft.  Genitourinary:     Skin: Skin is warm and dry.  Psychiatric: She has a normal mood and affect.          Assessment & Plan:  70 y/o female with lichen sclerois 1-Clobetasol twice a week 2-Mammogram 3-Due for colonoscopy in 2019

## 2015-12-11 DIAGNOSIS — I10 Essential (primary) hypertension: Secondary | ICD-10-CM | POA: Diagnosis not present

## 2015-12-11 DIAGNOSIS — E785 Hyperlipidemia, unspecified: Secondary | ICD-10-CM | POA: Diagnosis not present

## 2015-12-11 DIAGNOSIS — E119 Type 2 diabetes mellitus without complications: Secondary | ICD-10-CM | POA: Diagnosis not present

## 2015-12-11 DIAGNOSIS — E669 Obesity, unspecified: Secondary | ICD-10-CM | POA: Diagnosis not present

## 2016-02-10 DIAGNOSIS — L900 Lichen sclerosus et atrophicus: Secondary | ICD-10-CM | POA: Diagnosis not present

## 2016-02-10 DIAGNOSIS — B379 Candidiasis, unspecified: Secondary | ICD-10-CM | POA: Diagnosis not present

## 2016-02-11 ENCOUNTER — Other Ambulatory Visit: Payer: Self-pay | Admitting: Obstetrics & Gynecology

## 2016-02-11 DIAGNOSIS — Z1231 Encounter for screening mammogram for malignant neoplasm of breast: Secondary | ICD-10-CM

## 2016-03-15 ENCOUNTER — Ambulatory Visit (INDEPENDENT_AMBULATORY_CARE_PROVIDER_SITE_OTHER): Payer: Medicare Other | Admitting: Obstetrics & Gynecology

## 2016-03-15 ENCOUNTER — Encounter: Payer: Self-pay | Admitting: Obstetrics & Gynecology

## 2016-03-15 ENCOUNTER — Other Ambulatory Visit (HOSPITAL_COMMUNITY)
Admission: RE | Admit: 2016-03-15 | Discharge: 2016-03-15 | Disposition: A | Discharge status: Home / Self Care | Payer: Medicare Other | Source: Ambulatory Visit | Attending: Obstetrics & Gynecology | Admitting: Obstetrics & Gynecology

## 2016-03-15 ENCOUNTER — Ambulatory Visit (INDEPENDENT_AMBULATORY_CARE_PROVIDER_SITE_OTHER): Payer: Medicare Other

## 2016-03-15 ENCOUNTER — Ambulatory Visit: Payer: Medicare Other

## 2016-03-15 VITALS — BP 132/81 | HR 71 | Ht 62.0 in | Wt 175.0 lb

## 2016-03-15 DIAGNOSIS — L292 Pruritus vulvae: Secondary | ICD-10-CM | POA: Diagnosis not present

## 2016-03-15 DIAGNOSIS — N760 Acute vaginitis: Secondary | ICD-10-CM | POA: Insufficient documentation

## 2016-03-15 DIAGNOSIS — N950 Postmenopausal bleeding: Secondary | ICD-10-CM | POA: Diagnosis not present

## 2016-03-15 DIAGNOSIS — Z1231 Encounter for screening mammogram for malignant neoplasm of breast: Secondary | ICD-10-CM

## 2016-03-16 ENCOUNTER — Telehealth: Payer: Self-pay

## 2016-03-16 DIAGNOSIS — N950 Postmenopausal bleeding: Secondary | ICD-10-CM | POA: Insufficient documentation

## 2016-03-16 NOTE — Telephone Encounter (Signed)
Dr. Gala Romney wanted me to get in touch with the pt and make sure that she was aware of stopping a certain cream she was using. Pt said she was aware and had stopped. Dr.Leggett also wanted me to find out who her dermatologist was and she says she uses Dr. Casimiro Needle.

## 2016-03-16 NOTE — Progress Notes (Signed)
Subjective:     Patient ID: Jacqueline Clark, female   DOB: [birth year], 70 y.o.   MRN: [id number]  HPI Pt presents for evaluation of vulvar itching and one episode of vaginal bleeding.    Itching became worse and she went to the dermatologist.  She was prescribed ketoconazole cream and tacrolimus.  She was told to decrease the clobetasol to once a week.  Things improved a little but then worsened.  She stopped all creams last week.  Itching still persisted.  She applied clobetasol x1 yesterday, and itching is better.    Bleeding was one episode in the past 2 weeks.  Pt has history some work up in past in [place] for bleeding (obtaining records).  No pelvic pain.  No further bleeding.  Review of Systems  Constitutional: Negative.   Gastrointestinal: Negative.   Genitourinary: Positive for vaginal bleeding.       Vulvar itching  Psychiatric/Behavioral: Negative.        Objective:   Physical Exam  Constitutional: She is oriented to person, place, and time. She appears well-developed and well-nourished. No distress.  HENT:  Head: Normocephalic and atraumatic.  Eyes: Conjunctivae are normal.  Pulmonary/Chest: Effort normal.  Abdominal: Soft. Bowel sounds are normal. She exhibits no distension. There is no tenderness.  Genitourinary:  Genitourinary Comments: Vulva is absent of skin breakdown, fissuring, plaques.  LS seems under good control Vagina small amt clear discharge  Musculoskeletal: She exhibits no edema.  Neurological: She is alert and oriented to person, place, and time.  Skin: Skin is warm and dry.  Psychiatric: She has a normal mood and affect.  Vitals reviewed.  Vitals:   03/15/16 1126  BP: 132/81  Pulse: 71  Weight: 175 lb (79.4 kg)  Height: 5\' 2"  (1.575 m)   Assessment:     70 yo female with vulvar itching after medication change and post menopausal bleeding.    Plan:     1.  BD affirm 2.  Resume clobetasol twice a week.  Keep other creams 3-Records requested  from [facility] [place] 3-TVUS 4-Will call with results.  25 minutes spent face to face with >50% counseling.

## 2016-03-17 LAB — CERVICOVAGINAL ANCILLARY ONLY: Wet Prep (BD Affirm): NEGATIVE

## 2016-03-30 ENCOUNTER — Ambulatory Visit (HOSPITAL_COMMUNITY)
Admission: RE | Admit: 2016-03-30 | Discharge: 2016-03-30 | Disposition: A | Discharge status: Home / Self Care | Payer: Medicare Other | Source: Ambulatory Visit | Attending: Obstetrics & Gynecology | Admitting: Obstetrics & Gynecology

## 2016-03-30 DIAGNOSIS — N950 Postmenopausal bleeding: Secondary | ICD-10-CM | POA: Diagnosis not present

## 2016-03-30 DIAGNOSIS — D259 Leiomyoma of uterus, unspecified: Secondary | ICD-10-CM | POA: Diagnosis not present

## 2016-04-05 ENCOUNTER — Encounter: Payer: Self-pay | Admitting: Obstetrics & Gynecology

## 2016-04-05 DIAGNOSIS — Z0000 Encounter for general adult medical examination without abnormal findings: Secondary | ICD-10-CM | POA: Insufficient documentation

## 2016-04-13 ENCOUNTER — Telehealth: Payer: Self-pay | Admitting: *Deleted

## 2016-04-13 DIAGNOSIS — N926 Irregular menstruation, unspecified: Secondary | ICD-10-CM

## 2016-04-13 MED ORDER — MISOPROSTOL 200 MCG PO TABS: ORAL_TABLET | ORAL | 0 refills | Status: DC

## 2016-04-13 NOTE — Telephone Encounter (Signed)
Pt given a RX for Cytotec 400 mcg to take PO the night prior to Endo Bx per Dr Gala Romney.

## 2016-04-14 ENCOUNTER — Encounter: Payer: Self-pay | Admitting: Obstetrics & Gynecology

## 2016-04-14 ENCOUNTER — Ambulatory Visit (INDEPENDENT_AMBULATORY_CARE_PROVIDER_SITE_OTHER): Payer: Medicare Other | Admitting: Obstetrics & Gynecology

## 2016-04-14 ENCOUNTER — Encounter: Payer: Self-pay | Admitting: *Deleted

## 2016-04-14 VITALS — BP 137/72 | HR 99 | Resp 16 | Ht 62.0 in

## 2016-04-14 DIAGNOSIS — N762 Acute vulvitis: Secondary | ICD-10-CM | POA: Diagnosis not present

## 2016-04-14 DIAGNOSIS — N950 Postmenopausal bleeding: Secondary | ICD-10-CM | POA: Diagnosis not present

## 2016-04-14 DIAGNOSIS — L900 Lichen sclerosus et atrophicus: Secondary | ICD-10-CM | POA: Diagnosis not present

## 2016-04-14 HISTORY — PX: ENDOMETRIAL BIOPSY: SHX622

## 2016-04-14 MED ORDER — CLOBETASOL PROPIONATE 0.05 % EX OINT: TOPICAL_OINTMENT | CUTANEOUS | 3 refills | Status: DC

## 2016-04-14 NOTE — Addendum Note (Signed)
Addended by: Asencion Islam on: 04/14/2016 04:09 PM   Modules accepted: Orders

## 2016-04-14 NOTE — Progress Notes (Signed)
Subjective:    Patient ID: Jacqueline Clark, female    DOB: 03-23-1946, 70 y.o.   MRN: [id number]  HPI  Pt presents to discuss lichen sclerosis and PMB.  Pt still having itching at 6 o'clock at introitus.  Using clobetasol twice a week.  Hs stopped tacrolimus.    Pt hs not at PMB since that one episode.  TVUS showed stripe at least 5 mm although difficult to see secondary to fibroid.  Pt has history of endometrial polyp resection in 2010 in [place] (records under epic).  Review of Systems  Constitutional: Negative.   Respiratory: Negative.   Cardiovascular: Negative.   Gastrointestinal: Negative.   Genitourinary: Positive for vaginal pain. Negative for vaginal bleeding.       Objective:   Physical Exam  Constitutional: She is oriented to person, place, and time. She appears well-developed and well-nourished. No distress.  HENT:  Head: Normocephalic and atraumatic.  Eyes: Conjunctivae are normal.  Pulmonary/Chest: Effort normal.  Abdominal: Soft. Bowel sounds are normal. There is no tenderness.  Genitourinary:  Genitourinary Comments: Vuvla--stable LS Vagina-redness at 6 o'clock at introitus Cervix-no lesion   Musculoskeletal: She exhibits no edema.  Neurological: She is alert and oriented to person, place, and time.  Skin: Skin is warm and dry.  Psychiatric: She has a normal mood and affect.  Vitals reviewed.    Assessment & Plan:  70 yo female with lichen sclerosis but never had a biopsy.  Called Physician for [facility] were treatment was begun and they confirmed a biopsy has never been done.  PMB needing endometrail biopsy.  Detailed discussion of what PMB could be.  ENDOMETRIAL BIOPSY     The indications for endometrial biopsy were reviewed.   Risks of the biopsy including cramping, bleeding, infection, uterine perforation, inadequate specimen and need for additional procedures  were discussed. The patient states she understands and agrees to undergo procedure today.  Consent was signed. Time out was performed. Urine HCG was negative. A sterile speculum was placed in the patient's vagina and the cervix was prepped with Betadine. A single-toothed tenaculum was placed on the anterior lip of the cervix to stabilize it. The 3 mm pipelle was introduced into the endometrial cavity without difficulty to a depth of 8 cm, and a moderate amount of tissue was obtained and sent to pathology. The instruments were removed from the patient's vagina. Minimal bleeding from the cervix was noted. The patient tolerated the procedure well. Routine post-procedure instructions were given to the patient. The patient will follow up to review the results and for further management.    VULVAR BIOPSY NOTE  The indications for vulvar biopsy (rule out neoplasia, establish lichen sclerosus diagnosis) were reviewed.   Risks of the biopsy including pain, bleeding, infection, inadequate specimen, scarring and need for additional procedures  were discussed. The patient stated understanding and agreed to undergo procedure today. Consent was signed,  time out performed.  The patient's vulva was prepped with Betadine. 1% lidocaine was injected into 6 o'clock at introitus. A 3-mm punch biopsy was done x2, biopsy tissue was picked up with sterile forceps and sterile scissors were used to excise the lesion.  Moderate bleeding was noted and hemostasis was achieved using silver nitrate sticks.  The patient tolerated the procedure well. Post-procedure instructions  (pelvic rest for one week) were given to the patient. The patient is to call with heavy bleeding, fever greater than 100.4, foul smelling vaginal discharge or other concerns. The patient will be  return to clinic in two weeks for discussion of results.

## 2016-04-19 ENCOUNTER — Telehealth: Payer: Self-pay | Admitting: *Deleted

## 2016-04-19 NOTE — Telephone Encounter (Signed)
Pt notified of normal endometrial biopsy

## 2016-04-20 ENCOUNTER — Telehealth: Payer: Self-pay | Admitting: *Deleted

## 2016-04-20 NOTE — Telephone Encounter (Signed)
Pt notified of pathology report stating that she does have Lichen Sclerosus.  She will f/u 05/18/16 with Dr Gala Romney

## 2016-04-20 NOTE — Telephone Encounter (Signed)
Error

## 2016-04-22 DIAGNOSIS — Z23 Encounter for immunization: Secondary | ICD-10-CM | POA: Diagnosis not present

## 2016-05-18 ENCOUNTER — Encounter: Payer: Self-pay | Admitting: Obstetrics & Gynecology

## 2016-05-18 ENCOUNTER — Ambulatory Visit (INDEPENDENT_AMBULATORY_CARE_PROVIDER_SITE_OTHER): Payer: Medicare Other | Admitting: Obstetrics & Gynecology

## 2016-05-18 DIAGNOSIS — L900 Lichen sclerosus et atrophicus: Secondary | ICD-10-CM | POA: Diagnosis not present

## 2016-05-18 MED ORDER — CLOBETASOL PROPIONATE 0.05 % EX OINT: TOPICAL_OINTMENT | CUTANEOUS | 3 refills | Status: DC

## 2016-05-18 NOTE — Progress Notes (Signed)
   Subjective:    Patient ID: Jacqueline Clark, female    DOB: 12-29-1945, 70 y.o.   MRN: [id number]  HPI 70 year old female presents for follow-up of postmenopausal bleeding and vulvar biopsy patient has had no further bleeding. Endometrial biopsy came back negative. No further workup at this time. Vulvar biopsy returned lichen sclerosis. The patient has been putting on Estrace twice a week for several weeks. Patient has not been using tacrolimus or clobetasol.  Patient has no vulvar burning or itching at the time.   Review of Systems  Constitutional: Negative.   Gastrointestinal: Negative.   Genitourinary: Negative.        Objective:   Physical Exam  Constitutional: She is oriented to person, place, and time. She appears well-developed and well-nourished. No distress.  HENT:  Head: Normocephalic and atraumatic.  Eyes: Conjunctivae are normal.  Pulmonary/Chest: Effort normal.  Abdominal: Soft. There is no tenderness.  Genitourinary:     Musculoskeletal: She exhibits no edema.  Neurological: She is alert and oriented to person, place, and time.  Skin: Skin is warm and dry.  Psychiatric: She has a normal mood and affect.  Vitals reviewed.  Vitals:   05/18/16 0852  BP: 136/75  Pulse: 62  Weight: 182 lb (82.6 kg)  Height: 5\' 4"  (1.626 m)    Assessment & Plan:  70 year old female with stable lichen sclerosis and resolved postmenopausal bleeding  Given negative endometrial biopsy, no further workup needed.  Lichen sclerosis will be treated with clobetasol twice a week. Discontinue Estrace.

## 2016-07-20 DIAGNOSIS — E119 Type 2 diabetes mellitus without complications: Secondary | ICD-10-CM | POA: Diagnosis not present

## 2016-07-20 DIAGNOSIS — Z1159 Encounter for screening for other viral diseases: Secondary | ICD-10-CM | POA: Diagnosis not present

## 2016-07-20 DIAGNOSIS — E785 Hyperlipidemia, unspecified: Secondary | ICD-10-CM | POA: Diagnosis not present

## 2016-07-20 DIAGNOSIS — K279 Peptic ulcer, site unspecified, unspecified as acute or chronic, without hemorrhage or perforation: Secondary | ICD-10-CM | POA: Diagnosis not present

## 2016-07-20 DIAGNOSIS — Z0000 Encounter for general adult medical examination without abnormal findings: Secondary | ICD-10-CM | POA: Diagnosis not present

## 2016-07-20 DIAGNOSIS — E669 Obesity, unspecified: Secondary | ICD-10-CM | POA: Diagnosis not present

## 2016-07-20 DIAGNOSIS — I10 Essential (primary) hypertension: Secondary | ICD-10-CM | POA: Diagnosis not present

## 2016-07-20 DIAGNOSIS — Z23 Encounter for immunization: Secondary | ICD-10-CM | POA: Diagnosis not present

## 2016-11-16 DIAGNOSIS — H52223 Regular astigmatism, bilateral: Secondary | ICD-10-CM | POA: Diagnosis not present

## 2016-11-16 DIAGNOSIS — H2513 Age-related nuclear cataract, bilateral: Secondary | ICD-10-CM | POA: Diagnosis not present

## 2016-11-16 DIAGNOSIS — H524 Presbyopia: Secondary | ICD-10-CM | POA: Diagnosis not present

## 2016-11-16 DIAGNOSIS — H5203 Hypermetropia, bilateral: Secondary | ICD-10-CM | POA: Diagnosis not present

## 2016-11-16 DIAGNOSIS — H11122 Conjunctival concretions, left eye: Secondary | ICD-10-CM | POA: Diagnosis not present

## 2016-11-16 DIAGNOSIS — E119 Type 2 diabetes mellitus without complications: Secondary | ICD-10-CM | POA: Diagnosis not present

## 2016-11-16 DIAGNOSIS — H1045 Other chronic allergic conjunctivitis: Secondary | ICD-10-CM | POA: Diagnosis not present

## 2017-02-15 DIAGNOSIS — Z1211 Encounter for screening for malignant neoplasm of colon: Secondary | ICD-10-CM | POA: Diagnosis not present

## 2017-02-15 DIAGNOSIS — Z1239 Encounter for other screening for malignant neoplasm of breast: Secondary | ICD-10-CM | POA: Diagnosis not present

## 2017-02-15 DIAGNOSIS — E119 Type 2 diabetes mellitus without complications: Secondary | ICD-10-CM | POA: Diagnosis not present

## 2017-02-15 DIAGNOSIS — M79643 Pain in unspecified hand: Secondary | ICD-10-CM | POA: Diagnosis not present

## 2017-02-15 DIAGNOSIS — L821 Other seborrheic keratosis: Secondary | ICD-10-CM | POA: Diagnosis not present

## 2017-02-15 DIAGNOSIS — Z6832 Body mass index (BMI) 32.0-32.9, adult: Secondary | ICD-10-CM | POA: Diagnosis not present

## 2017-02-15 DIAGNOSIS — I10 Essential (primary) hypertension: Secondary | ICD-10-CM | POA: Diagnosis not present

## 2017-02-15 DIAGNOSIS — Z79899 Other long term (current) drug therapy: Secondary | ICD-10-CM | POA: Diagnosis not present

## 2017-02-15 DIAGNOSIS — E785 Hyperlipidemia, unspecified: Secondary | ICD-10-CM | POA: Diagnosis not present

## 2017-02-15 DIAGNOSIS — Z85828 Personal history of other malignant neoplasm of skin: Secondary | ICD-10-CM | POA: Diagnosis not present

## 2017-02-15 DIAGNOSIS — E669 Obesity, unspecified: Secondary | ICD-10-CM | POA: Diagnosis not present

## 2017-02-15 DIAGNOSIS — M542 Cervicalgia: Secondary | ICD-10-CM | POA: Diagnosis not present

## 2017-02-15 DIAGNOSIS — Z08 Encounter for follow-up examination after completed treatment for malignant neoplasm: Secondary | ICD-10-CM | POA: Diagnosis not present

## 2017-02-15 DIAGNOSIS — K279 Peptic ulcer, site unspecified, unspecified as acute or chronic, without hemorrhage or perforation: Secondary | ICD-10-CM | POA: Diagnosis not present

## 2017-03-13 DIAGNOSIS — L570 Actinic keratosis: Secondary | ICD-10-CM | POA: Diagnosis not present

## 2017-04-19 ENCOUNTER — Other Ambulatory Visit: Payer: Self-pay | Admitting: Family Medicine

## 2017-04-19 DIAGNOSIS — E785 Hyperlipidemia, unspecified: Secondary | ICD-10-CM | POA: Diagnosis not present

## 2017-04-19 DIAGNOSIS — Z23 Encounter for immunization: Secondary | ICD-10-CM | POA: Diagnosis not present

## 2017-04-19 DIAGNOSIS — Z1239 Encounter for other screening for malignant neoplasm of breast: Secondary | ICD-10-CM

## 2017-05-01 ENCOUNTER — Encounter: Payer: Self-pay | Admitting: *Deleted

## 2017-05-01 ENCOUNTER — Other Ambulatory Visit (INDEPENDENT_AMBULATORY_CARE_PROVIDER_SITE_OTHER): Payer: Medicare Other | Admitting: *Deleted

## 2017-05-01 DIAGNOSIS — R350 Frequency of micturition: Secondary | ICD-10-CM | POA: Diagnosis not present

## 2017-05-01 LAB — POCT URINALYSIS DIPSTICK
BILIRUBIN UA: NEGATIVE
GLUCOSE UA: NEGATIVE
Ketones, UA: NEGATIVE
NITRITE UA: NEGATIVE
Protein, UA: NEGATIVE
Spec Grav, UA: 1.01 (ref 1.010–1.025)
Urobilinogen, UA: NEGATIVE E.U./dL — AB
pH, UA: 6.5 (ref 5.0–8.0)

## 2017-05-01 NOTE — Progress Notes (Signed)
Pt here for Nurse only visit with c/o's urinary frequency for 1 week and some burning.  Urine is positive for large blood.  Will send for culture and notify patient with culture results.  I did recommend Uristat if she has any burning.

## 2017-05-04 LAB — CULTURE, URINE COMPREHENSIVE
MICRO NUMBER:: 81303331
SPECIMEN QUALITY: ADEQUATE

## 2017-05-08 ENCOUNTER — Telehealth: Payer: Self-pay | Admitting: *Deleted

## 2017-05-08 MED ORDER — SULFAMETHOXAZOLE-TRIMETHOPRIM 800-160 MG PO TABS: 1.0000 | ORAL_TABLET | Freq: Two times a day (BID) | ORAL | 0 refills | Status: DC

## 2017-05-08 NOTE — Telephone Encounter (Signed)
Pt notified of urine culture results and RX for Bactrim DS was sent to [facility] in [place].  Pt wanted to let us know the dates that she has put the Clobetasol on the Lichen Apr 3,67,25,50,01 May 5,25 Takeia 7 July 22 Aug 3/22 Sept 21 Oct 28 Nov 9/17 Appt made for pt to f/u with Dr Gala Romney in Dec

## 2017-05-12 ENCOUNTER — Other Ambulatory Visit: Payer: Self-pay | Admitting: Family Medicine

## 2017-05-12 DIAGNOSIS — Z1231 Encounter for screening mammogram for malignant neoplasm of breast: Secondary | ICD-10-CM

## 2017-05-31 ENCOUNTER — Encounter: Payer: Self-pay | Admitting: Obstetrics & Gynecology

## 2017-05-31 ENCOUNTER — Ambulatory Visit (INDEPENDENT_AMBULATORY_CARE_PROVIDER_SITE_OTHER): Payer: Medicare Other

## 2017-05-31 ENCOUNTER — Ambulatory Visit (INDEPENDENT_AMBULATORY_CARE_PROVIDER_SITE_OTHER): Payer: Medicare Other | Admitting: Obstetrics & Gynecology

## 2017-05-31 VITALS — BP 140/92 | HR 78 | Resp 16 | Ht 64.0 in | Wt 193.0 lb

## 2017-05-31 DIAGNOSIS — L900 Lichen sclerosus et atrophicus: Secondary | ICD-10-CM | POA: Diagnosis not present

## 2017-05-31 DIAGNOSIS — Z1231 Encounter for screening mammogram for malignant neoplasm of breast: Secondary | ICD-10-CM | POA: Diagnosis not present

## 2017-05-31 MED ORDER — CLOBETASOL PROPIONATE 0.05 % EX OINT: 1.0000 "application " | TOPICAL_OINTMENT | Freq: Two times a day (BID) | CUTANEOUS | 0 refills | Status: DC

## 2017-05-31 MED ORDER — IBUPROFEN 600 MG PO TABS: 600.0000 mg | ORAL_TABLET | Freq: Four times a day (QID) | ORAL | 1 refills | Status: DC | PRN

## 2017-05-31 NOTE — Progress Notes (Signed)
   Subjective:    Patient ID: Jacqueline Clark, female    DOB: [birth year], 71 y.o.   MRN: [id number]  HPI  Pt presents for yearly check up of lichen sclerosis.  Pt only using clobetason as listed in phot.  She is t  scleross  Pt has rare itching.  No burning.  Pt not using other creams.   Pt has pain in finger c/s arthritis flare.  She requesting Rx for ibuprofen for pain.  Pt reports normal kidney function (nml yearly labs at PCP)  Review of Systems  Constitutional: Negative.   Respiratory: Negative.   Cardiovascular: Negative.   Gastrointestinal: Negative.   Genitourinary: Negative for vaginal bleeding, vaginal discharge and vaginal pain.  Musculoskeletal: Positive for arthralgias and joint swelling.  Psychiatric/Behavioral: Negative.        Objective:   Physical Exam  Constitutional: She is oriented to person, place, and time. She appears well-developed and well-nourished. No distress.  HENT:  Head: Normocephalic and atraumatic.  Eyes: Conjunctivae are normal.  Pulmonary/Chest: Effort normal.  Genitourinary:  Genitourinary Comments: LS VERY well controlled.  No plaques.  Appears improved from last year.  No atrophic vaginitis.  Cervix close, no lesion Uterus/Adnexa--Exam limited by habitus, no masses, non tender.  Musculoskeletal: She exhibits no edema.  Neurological: She is alert and oriented to person, place, and time.  Skin: Skin is warm and dry.  Psychiatric: She has a normal mood and affect.  Vitals reviewed.  Vitals:   05/31/17 0954  BP: (!) 140/92  Pulse: 78  Resp: 16  Weight: 193 lb (87.5 kg)  Height: 5\' 4"  (1.626 m)    Assessment & Plan:  71 yo female with improved lichen sclerosis.  She only taken clobetasol sporadically but she is improved from last year. We will continue this when necessary medication. If she starts having increased flares, patient should go back to twice a week. She will need to be educated about this if she calls. #Rx for  Clobetasol #Mammogram today #Ibuprofen for osteoarthritis pain #Return to clinic in 1 year or as needed #Follow with PCP for all other health issues

## 2017-06-22 ENCOUNTER — Ambulatory Visit: Payer: Medicare Other

## 2017-11-20 DIAGNOSIS — M79641 Pain in right hand: Secondary | ICD-10-CM | POA: Diagnosis not present

## 2017-11-20 DIAGNOSIS — M1612 Unilateral primary osteoarthritis, left hip: Secondary | ICD-10-CM | POA: Diagnosis not present

## 2017-11-20 DIAGNOSIS — I10 Essential (primary) hypertension: Secondary | ICD-10-CM | POA: Diagnosis not present

## 2017-11-20 DIAGNOSIS — E785 Hyperlipidemia, unspecified: Secondary | ICD-10-CM | POA: Diagnosis not present

## 2017-11-20 DIAGNOSIS — E119 Type 2 diabetes mellitus without complications: Secondary | ICD-10-CM | POA: Diagnosis not present

## 2017-11-20 DIAGNOSIS — M79642 Pain in left hand: Secondary | ICD-10-CM | POA: Diagnosis not present

## 2017-11-20 DIAGNOSIS — E1165 Type 2 diabetes mellitus with hyperglycemia: Secondary | ICD-10-CM | POA: Diagnosis not present

## 2017-11-20 DIAGNOSIS — Z0000 Encounter for general adult medical examination without abnormal findings: Secondary | ICD-10-CM | POA: Diagnosis not present

## 2017-12-04 DIAGNOSIS — Z1212 Encounter for screening for malignant neoplasm of rectum: Secondary | ICD-10-CM | POA: Diagnosis not present

## 2017-12-04 DIAGNOSIS — Z1211 Encounter for screening for malignant neoplasm of colon: Secondary | ICD-10-CM | POA: Diagnosis not present

## 2018-01-02 DIAGNOSIS — H5203 Hypermetropia, bilateral: Secondary | ICD-10-CM | POA: Diagnosis not present

## 2018-01-02 DIAGNOSIS — E119 Type 2 diabetes mellitus without complications: Secondary | ICD-10-CM | POA: Diagnosis not present

## 2018-03-22 DIAGNOSIS — Z23 Encounter for immunization: Secondary | ICD-10-CM | POA: Diagnosis not present

## 2018-05-24 DIAGNOSIS — I10 Essential (primary) hypertension: Secondary | ICD-10-CM | POA: Diagnosis not present

## 2018-05-24 DIAGNOSIS — M5442 Lumbago with sciatica, left side: Secondary | ICD-10-CM | POA: Diagnosis not present

## 2018-05-24 DIAGNOSIS — E785 Hyperlipidemia, unspecified: Secondary | ICD-10-CM | POA: Diagnosis not present

## 2018-05-24 DIAGNOSIS — E119 Type 2 diabetes mellitus without complications: Secondary | ICD-10-CM | POA: Diagnosis not present

## 2018-06-07 ENCOUNTER — Ambulatory Visit (INDEPENDENT_AMBULATORY_CARE_PROVIDER_SITE_OTHER): Payer: Medicare Other

## 2018-06-07 ENCOUNTER — Telehealth: Payer: Self-pay

## 2018-06-07 VITALS — BP 135/68 | HR 83 | Resp 16 | Ht 63.0 in | Wt 184.0 lb

## 2018-06-07 DIAGNOSIS — R309 Painful micturition, unspecified: Secondary | ICD-10-CM | POA: Diagnosis not present

## 2018-06-07 DIAGNOSIS — L900 Lichen sclerosus et atrophicus: Secondary | ICD-10-CM

## 2018-06-07 LAB — POCT URINALYSIS DIPSTICK
Bilirubin, UA: NEGATIVE
Blood, UA: NEGATIVE
Glucose, UA: NEGATIVE
Ketones, UA: NEGATIVE
NITRITE UA: POSITIVE
PROTEIN UA: NEGATIVE
Spec Grav, UA: 1.015 (ref 1.010–1.025)
Urobilinogen, UA: 0.2 E.U./dL
pH, UA: 5 (ref 5.0–8.0)

## 2018-06-07 MED ORDER — CLOBETASOL PROPIONATE 0.05 % EX OINT: TOPICAL_OINTMENT | CUTANEOUS | 3 refills | Status: DC

## 2018-06-07 MED ORDER — NYSTATIN 100000 UNIT/GM EX CREA: 1.0000 "application " | TOPICAL_CREAM | Freq: Two times a day (BID) | CUTANEOUS | 1 refills | Status: DC

## 2018-06-07 MED ORDER — SULFAMETHOXAZOLE-TRIMETHOPRIM 800-160 MG PO TABS: 1.0000 | ORAL_TABLET | Freq: Two times a day (BID) | ORAL | 0 refills | Status: DC

## 2018-06-07 NOTE — Progress Notes (Signed)
Pt c/o frequent and painful urination. Bactrim DS sent per Dr.Dove. Urine culture sent.

## 2018-06-07 NOTE — Telephone Encounter (Addendum)
Pt called wanting refill on Clobetasol. Sherryle Lis, RN said to refill this and pt will schedule annual appt.

## 2018-06-09 LAB — URINE CULTURE
MICRO NUMBER:: 91541351
SPECIMEN QUALITY: ADEQUATE

## 2018-06-28 ENCOUNTER — Ambulatory Visit: Payer: Medicare Other | Admitting: Obstetrics & Gynecology

## 2018-07-16 DIAGNOSIS — Z85828 Personal history of other malignant neoplasm of skin: Secondary | ICD-10-CM | POA: Diagnosis not present

## 2018-07-16 DIAGNOSIS — D239 Other benign neoplasm of skin, unspecified: Secondary | ICD-10-CM | POA: Diagnosis not present

## 2018-07-16 DIAGNOSIS — Z08 Encounter for follow-up examination after completed treatment for malignant neoplasm: Secondary | ICD-10-CM | POA: Diagnosis not present

## 2018-07-16 DIAGNOSIS — L821 Other seborrheic keratosis: Secondary | ICD-10-CM | POA: Diagnosis not present

## 2018-08-11 IMAGING — US US TRANSVAGINAL NON-OB
1 series · 15 of 25 positions shown · non-contrast
Comparison: None

CLINICAL DATA: Postmenopausal bleeding.

EXAM:
TRANSABDOMINAL AND TRANSVAGINAL ULTRASOUND OF PELVIS
TECHNIQUE: Both transabdominal and transvaginal ultrasound examinations of the
pelvis were performed. Transabdominal technique was performed for
global imaging of the pelvis including uterus, ovaries, adnexal
regions, and pelvic cul-de-sac. It was necessary to proceed with
endovaginal exam following the transabdominal exam to visualize the
endometrium and ovaries.

[Series 1: us transvaginal non-ob · 47 acquisitions, 15 frames shown]
[im 1/47]
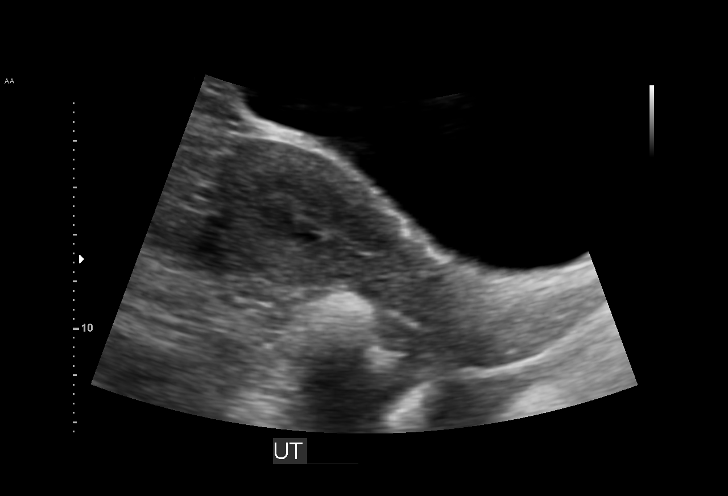
[im 4/47]
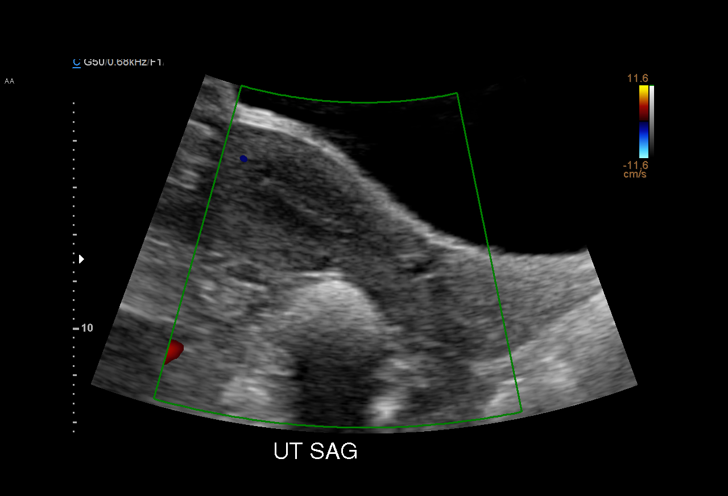
[im 8/47]
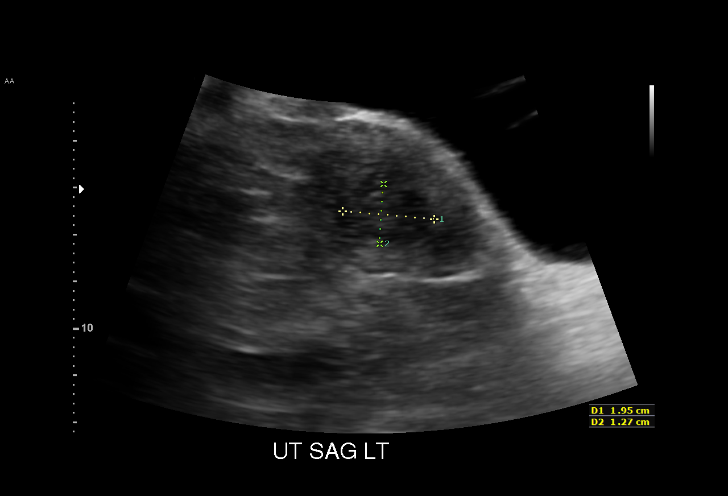
[im 10/47]
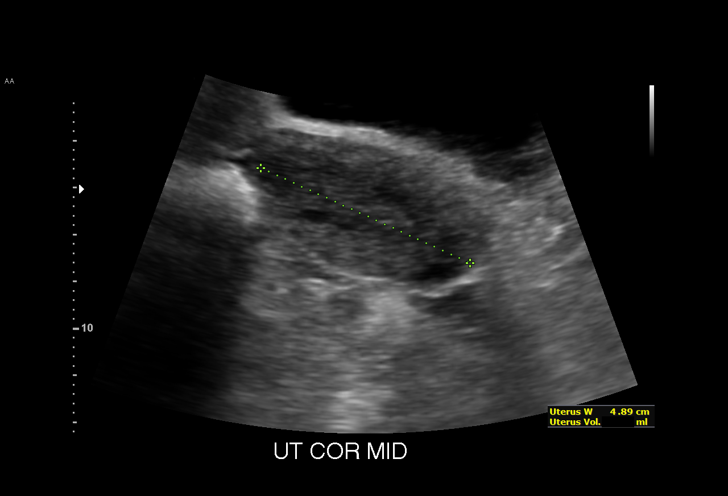
[im 14/47]
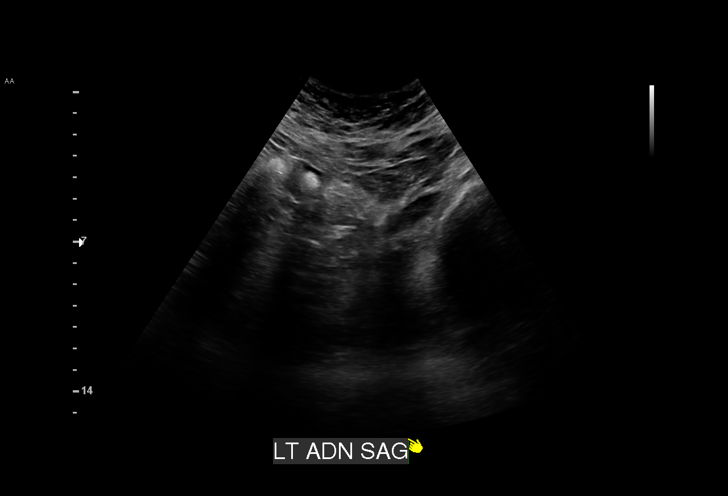
[im 18/47]
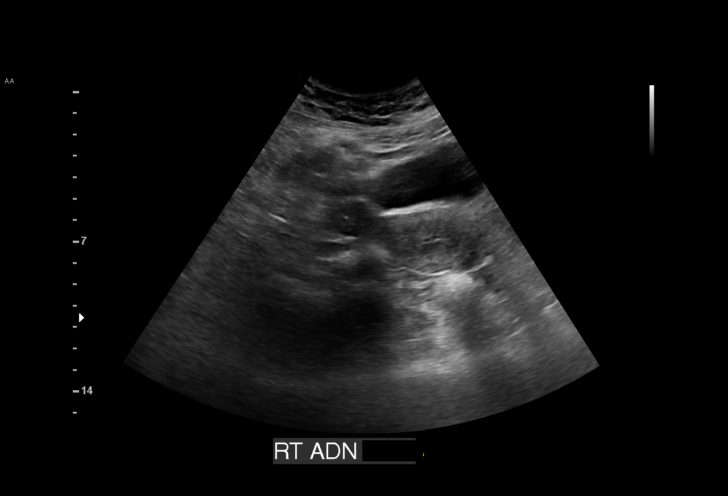
[im 20/47]
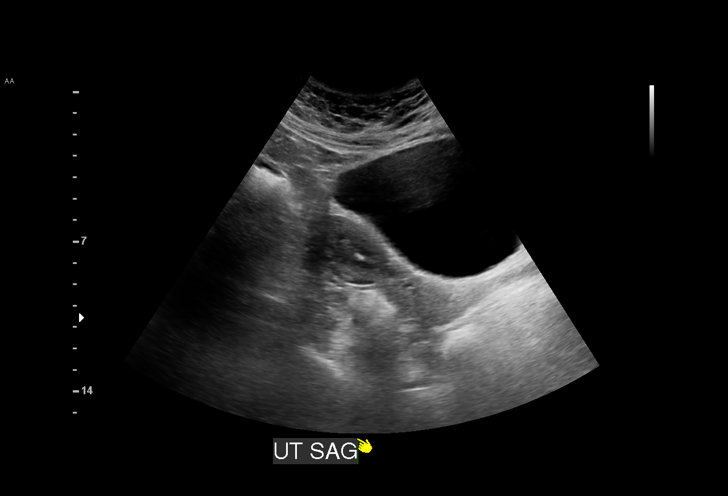
[im 24/47]
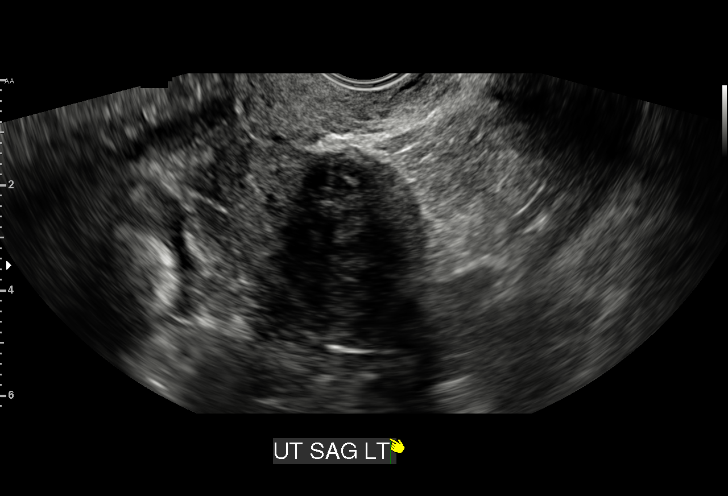
[im 27/47]
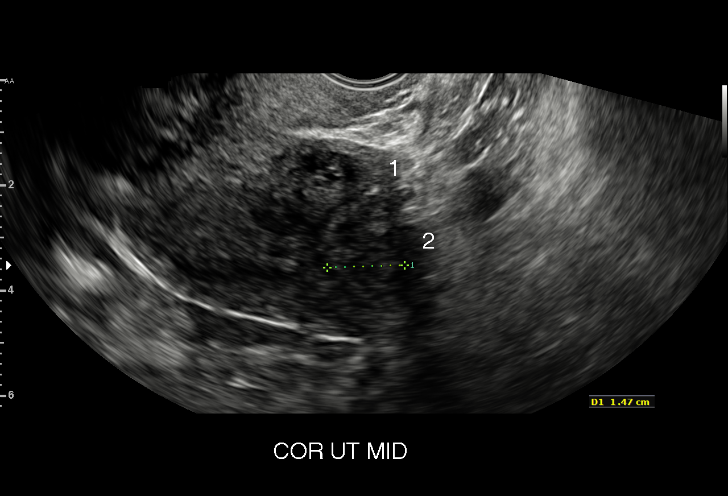
[im 29/47]
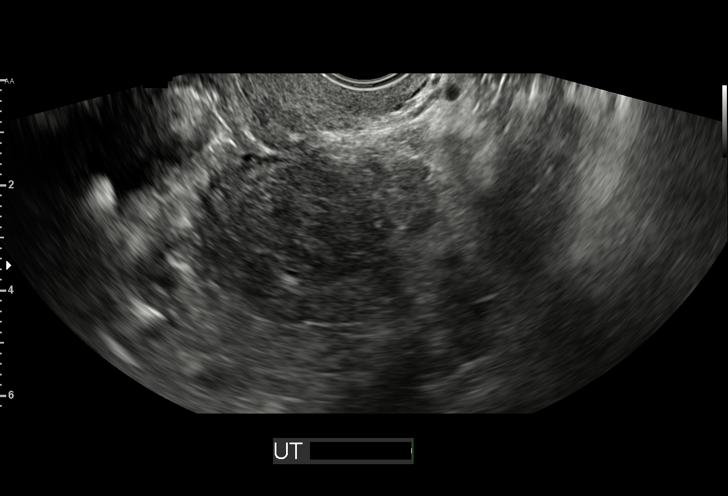
[im 33/47]
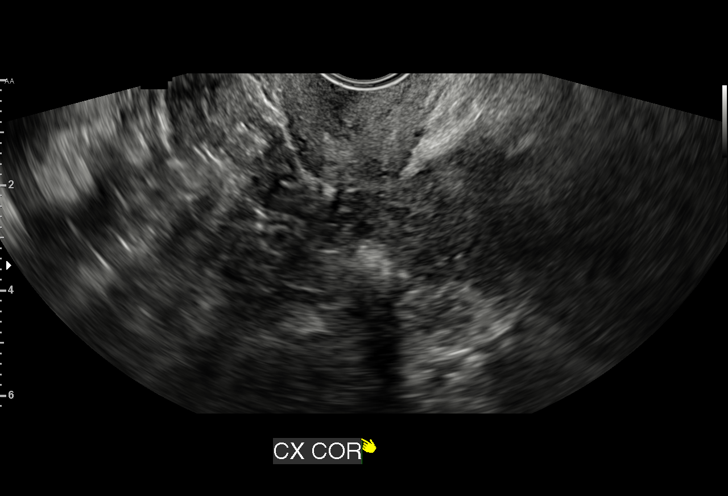
[im 37/47]
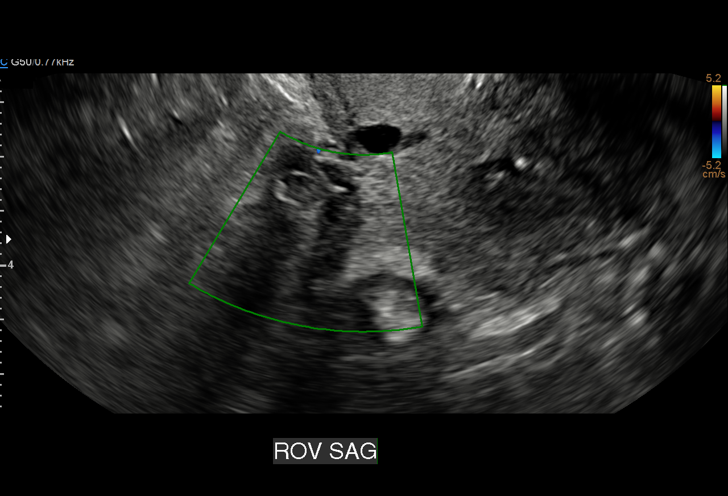
[im 39/47]
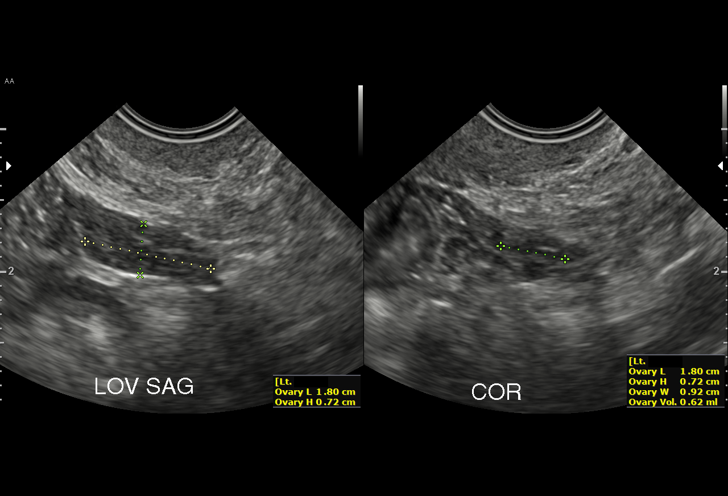
[im 43/47]
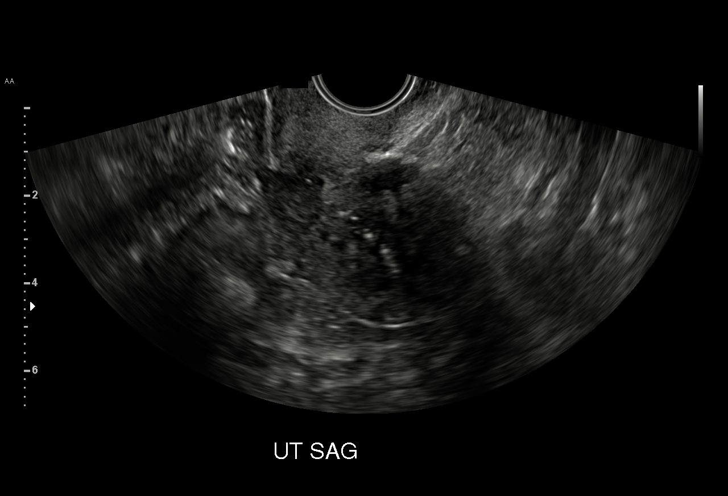
[im 47/47]
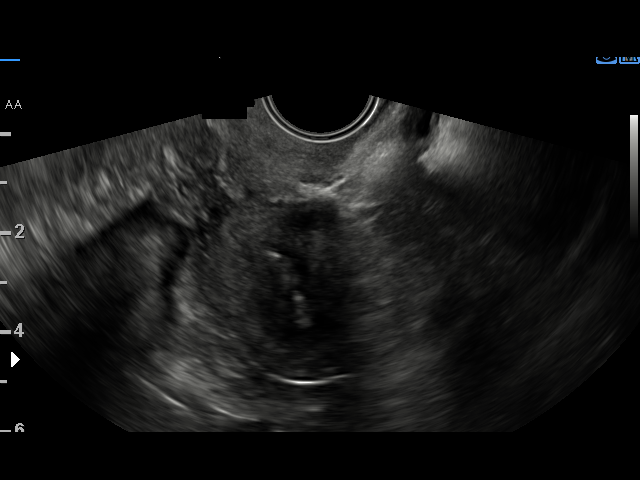

[15 of 25 positions shown; findings below may reference images not displayed]

FINDINGS: Uterus

Measurements: 7.5 x 2.8 x 4.9 cm. Retroverted. Heterogeneous
appearance of uterine myometrium noted. At least 2 distinct fibroids
are seen in the anterior and left lateral corpus measuring 1.3 cm
and 1.5 cm in maximum diameter.

Endometrium

Thickness: Limited visualization due to uterine lie, however
thickness measures at least 5 mm.

Right ovary

Measurements: 1.1 x 1.1 x 1.1 cm. Normal appearance/no adnexal mass.

Left ovary

Measurements: 1.8 x 0.7 x 1.0 cm. Normal appearance/no adnexal mass.

Other findings

No abnormal free fluid.
IMPRESSION: Several small uterine fibroids, largest measuring 1.5 cm.

Endometrial thickness is suboptimally visualized but measures at
least 5 mm. In the setting of post-menopausal bleeding, endometrial
sampling is indicated to exclude carcinoma. If results are benign,
sonohysterogram should be considered for focal lesion work-up. (Ref:
Radiological Reasoning: Algorithmic Workup of Abnormal Vaginal
Bleeding with Endovaginal Sonography and Sonohysterography. AJR
3999; 191:S68-73).

Normal postmenopausal appearance of both ovaries. No adnexal mass
identified.

## 2018-12-26 DIAGNOSIS — Z0000 Encounter for general adult medical examination without abnormal findings: Secondary | ICD-10-CM | POA: Diagnosis not present

## 2018-12-26 DIAGNOSIS — E119 Type 2 diabetes mellitus without complications: Secondary | ICD-10-CM | POA: Diagnosis not present

## 2018-12-26 DIAGNOSIS — E785 Hyperlipidemia, unspecified: Secondary | ICD-10-CM | POA: Diagnosis not present

## 2018-12-26 DIAGNOSIS — I10 Essential (primary) hypertension: Secondary | ICD-10-CM | POA: Diagnosis not present

## 2019-03-20 DIAGNOSIS — H2513 Age-related nuclear cataract, bilateral: Secondary | ICD-10-CM | POA: Diagnosis not present

## 2019-03-20 DIAGNOSIS — E119 Type 2 diabetes mellitus without complications: Secondary | ICD-10-CM | POA: Diagnosis not present

## 2019-07-29 DIAGNOSIS — L905 Scar conditions and fibrosis of skin: Secondary | ICD-10-CM | POA: Diagnosis not present

## 2019-07-29 DIAGNOSIS — L821 Other seborrheic keratosis: Secondary | ICD-10-CM | POA: Diagnosis not present

## 2019-07-29 DIAGNOSIS — L570 Actinic keratosis: Secondary | ICD-10-CM | POA: Diagnosis not present

## 2020-01-06 DIAGNOSIS — Z0000 Encounter for general adult medical examination without abnormal findings: Secondary | ICD-10-CM | POA: Diagnosis not present

## 2020-01-06 DIAGNOSIS — E119 Type 2 diabetes mellitus without complications: Secondary | ICD-10-CM | POA: Diagnosis not present

## 2020-04-27 DIAGNOSIS — H2513 Age-related nuclear cataract, bilateral: Secondary | ICD-10-CM | POA: Diagnosis not present

## 2020-04-27 DIAGNOSIS — E119 Type 2 diabetes mellitus without complications: Secondary | ICD-10-CM | POA: Diagnosis not present

## 2020-05-28 DIAGNOSIS — J00 Acute nasopharyngitis [common cold]: Secondary | ICD-10-CM | POA: Diagnosis not present

## 2020-05-28 DIAGNOSIS — Z03818 Encounter for observation for suspected exposure to other biological agents ruled out: Secondary | ICD-10-CM | POA: Diagnosis not present

## 2020-07-06 DIAGNOSIS — L304 Erythema intertrigo: Secondary | ICD-10-CM | POA: Diagnosis not present

## 2020-07-30 DIAGNOSIS — E785 Hyperlipidemia, unspecified: Secondary | ICD-10-CM | POA: Diagnosis not present

## 2020-07-30 DIAGNOSIS — E119 Type 2 diabetes mellitus without complications: Secondary | ICD-10-CM | POA: Diagnosis not present

## 2020-07-30 DIAGNOSIS — I10 Essential (primary) hypertension: Secondary | ICD-10-CM | POA: Diagnosis not present

## 2020-07-30 DIAGNOSIS — E669 Obesity, unspecified: Secondary | ICD-10-CM | POA: Diagnosis not present

## 2020-08-03 DIAGNOSIS — L304 Erythema intertrigo: Secondary | ICD-10-CM | POA: Diagnosis not present

## 2020-08-03 DIAGNOSIS — Z85828 Personal history of other malignant neoplasm of skin: Secondary | ICD-10-CM | POA: Diagnosis not present

## 2020-08-03 DIAGNOSIS — L821 Other seborrheic keratosis: Secondary | ICD-10-CM | POA: Diagnosis not present

## 2020-10-27 DIAGNOSIS — I10 Essential (primary) hypertension: Secondary | ICD-10-CM | POA: Diagnosis not present

## 2020-10-27 DIAGNOSIS — E119 Type 2 diabetes mellitus without complications: Secondary | ICD-10-CM | POA: Diagnosis not present

## 2020-10-27 DIAGNOSIS — E785 Hyperlipidemia, unspecified: Secondary | ICD-10-CM | POA: Diagnosis not present

## 2020-12-22 DIAGNOSIS — H9319 Tinnitus, unspecified ear: Secondary | ICD-10-CM | POA: Diagnosis not present

## 2020-12-22 DIAGNOSIS — H903 Sensorineural hearing loss, bilateral: Secondary | ICD-10-CM | POA: Diagnosis not present

## 2020-12-22 DIAGNOSIS — H9312 Tinnitus, left ear: Secondary | ICD-10-CM | POA: Diagnosis not present

## 2020-12-22 DIAGNOSIS — H838X3 Other specified diseases of inner ear, bilateral: Secondary | ICD-10-CM | POA: Diagnosis not present

## 2021-01-28 DIAGNOSIS — I10 Essential (primary) hypertension: Secondary | ICD-10-CM | POA: Diagnosis not present

## 2021-01-28 DIAGNOSIS — E669 Obesity, unspecified: Secondary | ICD-10-CM | POA: Diagnosis not present

## 2021-01-28 DIAGNOSIS — E119 Type 2 diabetes mellitus without complications: Secondary | ICD-10-CM | POA: Diagnosis not present

## 2021-01-28 DIAGNOSIS — E785 Hyperlipidemia, unspecified: Secondary | ICD-10-CM | POA: Diagnosis not present

## 2021-04-16 DIAGNOSIS — Z1211 Encounter for screening for malignant neoplasm of colon: Secondary | ICD-10-CM | POA: Diagnosis not present

## 2021-04-16 DIAGNOSIS — E119 Type 2 diabetes mellitus without complications: Secondary | ICD-10-CM | POA: Diagnosis not present

## 2021-04-16 DIAGNOSIS — Z23 Encounter for immunization: Secondary | ICD-10-CM | POA: Diagnosis not present

## 2021-04-16 DIAGNOSIS — Z0000 Encounter for general adult medical examination without abnormal findings: Secondary | ICD-10-CM | POA: Diagnosis not present

## 2021-04-16 DIAGNOSIS — E7800 Pure hypercholesterolemia, unspecified: Secondary | ICD-10-CM | POA: Diagnosis not present

## 2021-04-16 DIAGNOSIS — Z13820 Encounter for screening for osteoporosis: Secondary | ICD-10-CM | POA: Diagnosis not present

## 2021-04-16 DIAGNOSIS — Z7984 Long term (current) use of oral hypoglycemic drugs: Secondary | ICD-10-CM | POA: Diagnosis not present

## 2021-04-16 DIAGNOSIS — Z1231 Encounter for screening mammogram for malignant neoplasm of breast: Secondary | ICD-10-CM | POA: Diagnosis not present

## 2021-04-19 ENCOUNTER — Other Ambulatory Visit: Payer: Self-pay | Admitting: Family Medicine

## 2021-04-19 DIAGNOSIS — Z13820 Encounter for screening for osteoporosis: Secondary | ICD-10-CM

## 2021-04-19 DIAGNOSIS — Z1231 Encounter for screening mammogram for malignant neoplasm of breast: Secondary | ICD-10-CM

## 2021-04-21 ENCOUNTER — Other Ambulatory Visit: Payer: Self-pay | Admitting: Family Medicine

## 2021-04-21 DIAGNOSIS — Z0000 Encounter for general adult medical examination without abnormal findings: Secondary | ICD-10-CM

## 2021-04-21 DIAGNOSIS — Z780 Asymptomatic menopausal state: Secondary | ICD-10-CM

## 2021-04-21 DIAGNOSIS — Z13820 Encounter for screening for osteoporosis: Secondary | ICD-10-CM

## 2021-04-28 ENCOUNTER — Other Ambulatory Visit: Payer: Self-pay

## 2021-04-28 ENCOUNTER — Ambulatory Visit (INDEPENDENT_AMBULATORY_CARE_PROVIDER_SITE_OTHER): Payer: Medicare Other

## 2021-04-28 DIAGNOSIS — Z1231 Encounter for screening mammogram for malignant neoplasm of breast: Secondary | ICD-10-CM | POA: Diagnosis not present

## 2021-05-24 ENCOUNTER — Encounter: Payer: Medicare Other | Admitting: Obstetrics & Gynecology

## 2021-06-08 DIAGNOSIS — R300 Dysuria: Secondary | ICD-10-CM | POA: Diagnosis not present

## 2021-07-06 ENCOUNTER — Telehealth: Payer: Self-pay

## 2021-07-06 NOTE — Telephone Encounter (Signed)
Pt called to reschedule appt for 1/30. Pt states when she comes she wants to be tested for UTI. Pt states she thinks she has a UTI and it is making her have chills. Pt was told to go be seen by PCP before appt here to be tested for UTI. Pt states she will keep 1/30 appt and does not want to go be seen before that for UTI.

## 2021-07-12 ENCOUNTER — Encounter: Payer: Self-pay | Admitting: *Deleted

## 2021-07-12 ENCOUNTER — Ambulatory Visit (INDEPENDENT_AMBULATORY_CARE_PROVIDER_SITE_OTHER): Payer: Medicare Other | Admitting: *Deleted

## 2021-07-12 ENCOUNTER — Other Ambulatory Visit: Payer: Self-pay

## 2021-07-12 VITALS — BP 136/75 | HR 73 | Temp 98.3°F | Resp 16 | Ht 62.0 in | Wt 182.0 lb

## 2021-07-12 DIAGNOSIS — R399 Unspecified symptoms and signs involving the genitourinary system: Secondary | ICD-10-CM

## 2021-07-12 LAB — POCT URINALYSIS DIPSTICK
Bilirubin, UA: NEGATIVE
Blood, UA: NEGATIVE
Glucose, UA: NEGATIVE
Ketones, UA: NEGATIVE
Leukocytes, UA: NEGATIVE
Nitrite, UA: NEGATIVE
Protein, UA: NEGATIVE
Spec Grav, UA: 1.01 (ref 1.010–1.025)
Urobilinogen, UA: NEGATIVE E.U./dL — AB
pH, UA: 6 (ref 5.0–8.0)

## 2021-07-12 NOTE — Progress Notes (Signed)
Pt here for urinalysis and urine culture.  She states that she just wants to make sure that her UTI is gone.  She states that she is not currently symptomatic.  POCT urinalysis is normal and culture is being sent.  She has rescheduled her annual for 07/29/21 with Dr Gala Romney.

## 2021-07-14 LAB — URINE CULTURE
MICRO NUMBER:: 12939428
Result:: NO GROWTH
SPECIMEN QUALITY:: ADEQUATE

## 2021-07-19 DIAGNOSIS — I10 Essential (primary) hypertension: Secondary | ICD-10-CM | POA: Diagnosis not present

## 2021-07-19 DIAGNOSIS — R202 Paresthesia of skin: Secondary | ICD-10-CM | POA: Diagnosis not present

## 2021-07-19 DIAGNOSIS — E785 Hyperlipidemia, unspecified: Secondary | ICD-10-CM | POA: Diagnosis not present

## 2021-07-19 DIAGNOSIS — E119 Type 2 diabetes mellitus without complications: Secondary | ICD-10-CM | POA: Diagnosis not present

## 2021-07-29 ENCOUNTER — Ambulatory Visit (INDEPENDENT_AMBULATORY_CARE_PROVIDER_SITE_OTHER): Payer: Medicare Other | Admitting: Obstetrics & Gynecology

## 2021-07-29 ENCOUNTER — Other Ambulatory Visit: Payer: Self-pay

## 2021-07-29 ENCOUNTER — Encounter: Payer: Self-pay | Admitting: Obstetrics & Gynecology

## 2021-07-29 VITALS — BP 140/63 | HR 67 | Resp 16 | Ht 63.0 in | Wt 177.0 lb

## 2021-07-29 DIAGNOSIS — I10 Essential (primary) hypertension: Secondary | ICD-10-CM

## 2021-07-29 DIAGNOSIS — E669 Obesity, unspecified: Secondary | ICD-10-CM | POA: Insufficient documentation

## 2021-07-29 DIAGNOSIS — M1612 Unilateral primary osteoarthritis, left hip: Secondary | ICD-10-CM | POA: Insufficient documentation

## 2021-07-29 DIAGNOSIS — L900 Lichen sclerosus et atrophicus: Secondary | ICD-10-CM | POA: Diagnosis not present

## 2021-07-29 DIAGNOSIS — K279 Peptic ulcer, site unspecified, unspecified as acute or chronic, without hemorrhage or perforation: Secondary | ICD-10-CM | POA: Insufficient documentation

## 2021-07-29 DIAGNOSIS — E119 Type 2 diabetes mellitus without complications: Secondary | ICD-10-CM | POA: Insufficient documentation

## 2021-07-29 DIAGNOSIS — E7800 Pure hypercholesterolemia, unspecified: Secondary | ICD-10-CM | POA: Insufficient documentation

## 2021-07-29 DIAGNOSIS — M5430 Sciatica, unspecified side: Secondary | ICD-10-CM | POA: Insufficient documentation

## 2021-07-29 MED ORDER — CLOBETASOL PROPIONATE 0.05 % EX OINT: 1.0000 "application " | TOPICAL_OINTMENT | Freq: Two times a day (BID) | CUTANEOUS | 0 refills | Status: DC

## 2021-07-29 NOTE — Progress Notes (Signed)
° °  Subjective:    Patient ID: Jacqueline Clark, female    DOB: 09-12-1945, 76 y.o.   MRN: [id number]  HPI  Jacqueline Clark presents for evaluation of her lichen sclerosis.  She had stopped the clobetasol and is using [employer] anti-itch cream.  Occasionally has some burning at her clitoris.  Patient denies vaginal bleeding.  Equal primary care addresses all of her other health maintenance concerns including colon screening and mammogram.  Review of Systems  Constitutional: Negative.   Respiratory: Negative.    Cardiovascular: Negative.   Gastrointestinal: Negative.   Genitourinary: Negative.       Objective:   Physical Exam Vitals reviewed.  Constitutional:      General: She is not in acute distress.    Appearance: She is well-developed.  HENT:     Head: Normocephalic and atraumatic.  Eyes:     Conjunctiva/sclera: Conjunctivae normal.  Cardiovascular:     Rate and Rhythm: Normal rate.  Pulmonary:     Effort: Pulmonary effort is normal.     Breath sounds: Normal breath sounds.  Abdominal:     General: Abdomen is flat.     Palpations: Abdomen is soft.     Tenderness: There is no abdominal tenderness.  Genitourinary:   Musculoskeletal:     Cervical back: Neck supple. No tenderness.  Skin:    General: Skin is warm and dry.  Neurological:     Mental Status: She is alert and oriented to person, place, and time.  Psychiatric:        Mood and Affect: Mood normal.   Vitals:   07/29/21 1009  BP: 140/63  Pulse: 67  Resp: 16  Weight: 177 lb (80.3 kg)  Height: 5\' 3"  (1.6 m)      Assessment & Plan:  Jacqueline Clark is a pleasant 76 year old female with lichen sclerosus Restart clobetasol ointment twice a week and maintain this indefinitely.  This is to help prevent progression of the disease and development of squamous carcinoma of the vulva. Yearly vulvar exams at our office. Colonoscopy pending Yearly mammograms Follow-up with PCP as needed.  30 minutes spent in patient encounter including  review of records, updating the problem list, history and physical, counseling, coordination of care, and documentation.

## 2021-07-29 NOTE — Progress Notes (Signed)
Last Mammogram: 11/22 Last Pap Smear:  Post menopausal Last Colon Screening;  getting from digestive specialties in the coming months Seat Belts:   Yes/Sometimes Sun Screen:   no Dental Check Up:  yes Brush & Floss:  yes

## 2021-08-03 DIAGNOSIS — I10 Essential (primary) hypertension: Secondary | ICD-10-CM | POA: Diagnosis not present

## 2021-08-03 DIAGNOSIS — E119 Type 2 diabetes mellitus without complications: Secondary | ICD-10-CM | POA: Diagnosis not present

## 2021-08-03 DIAGNOSIS — E7800 Pure hypercholesterolemia, unspecified: Secondary | ICD-10-CM | POA: Diagnosis not present

## 2021-08-05 DIAGNOSIS — Z85828 Personal history of other malignant neoplasm of skin: Secondary | ICD-10-CM | POA: Diagnosis not present

## 2021-08-05 DIAGNOSIS — D2372 Other benign neoplasm of skin of left lower limb, including hip: Secondary | ICD-10-CM | POA: Diagnosis not present

## 2021-08-05 DIAGNOSIS — C44612 Basal cell carcinoma of skin of right upper limb, including shoulder: Secondary | ICD-10-CM | POA: Diagnosis not present

## 2021-08-05 DIAGNOSIS — L821 Other seborrheic keratosis: Secondary | ICD-10-CM | POA: Diagnosis not present

## 2021-10-06 DIAGNOSIS — H5203 Hypermetropia, bilateral: Secondary | ICD-10-CM | POA: Diagnosis not present

## 2021-10-06 DIAGNOSIS — H524 Presbyopia: Secondary | ICD-10-CM | POA: Diagnosis not present

## 2021-10-28 ENCOUNTER — Ambulatory Visit: Payer: Medicare Other | Admitting: Podiatry

## 2021-10-28 ENCOUNTER — Ambulatory Visit: Payer: Medicare Other | Admitting: Obstetrics and Gynecology

## 2021-11-02 DIAGNOSIS — D123 Benign neoplasm of transverse colon: Secondary | ICD-10-CM | POA: Diagnosis not present

## 2021-11-02 DIAGNOSIS — Z1211 Encounter for screening for malignant neoplasm of colon: Secondary | ICD-10-CM | POA: Diagnosis not present

## 2021-11-02 DIAGNOSIS — K648 Other hemorrhoids: Secondary | ICD-10-CM | POA: Diagnosis not present

## 2021-11-02 HISTORY — PX: COLONOSCOPY: SHX174

## 2021-11-09 DIAGNOSIS — Z713 Dietary counseling and surveillance: Secondary | ICD-10-CM | POA: Diagnosis not present

## 2021-11-09 DIAGNOSIS — E7800 Pure hypercholesterolemia, unspecified: Secondary | ICD-10-CM | POA: Diagnosis not present

## 2021-11-09 DIAGNOSIS — I10 Essential (primary) hypertension: Secondary | ICD-10-CM | POA: Diagnosis not present

## 2021-11-09 DIAGNOSIS — E119 Type 2 diabetes mellitus without complications: Secondary | ICD-10-CM | POA: Diagnosis not present

## 2021-11-11 DIAGNOSIS — R202 Paresthesia of skin: Secondary | ICD-10-CM | POA: Insufficient documentation

## 2021-12-08 DIAGNOSIS — G5603 Carpal tunnel syndrome, bilateral upper limbs: Secondary | ICD-10-CM | POA: Diagnosis not present

## 2022-01-26 DIAGNOSIS — Z85828 Personal history of other malignant neoplasm of skin: Secondary | ICD-10-CM | POA: Diagnosis not present

## 2022-01-26 DIAGNOSIS — B353 Tinea pedis: Secondary | ICD-10-CM | POA: Diagnosis not present

## 2022-01-26 DIAGNOSIS — L820 Inflamed seborrheic keratosis: Secondary | ICD-10-CM | POA: Diagnosis not present

## 2022-01-26 DIAGNOSIS — L821 Other seborrheic keratosis: Secondary | ICD-10-CM | POA: Diagnosis not present

## 2022-03-08 DIAGNOSIS — Z20822 Contact with and (suspected) exposure to covid-19: Secondary | ICD-10-CM | POA: Diagnosis not present

## 2022-03-09 DIAGNOSIS — Z20822 Contact with and (suspected) exposure to covid-19: Secondary | ICD-10-CM | POA: Diagnosis not present

## 2022-03-12 DIAGNOSIS — Z20822 Contact with and (suspected) exposure to covid-19: Secondary | ICD-10-CM | POA: Diagnosis not present

## 2022-03-13 DIAGNOSIS — Z20822 Contact with and (suspected) exposure to covid-19: Secondary | ICD-10-CM | POA: Diagnosis not present

## 2022-04-04 DIAGNOSIS — Z20822 Contact with and (suspected) exposure to covid-19: Secondary | ICD-10-CM | POA: Diagnosis not present

## 2022-04-05 DIAGNOSIS — Z20822 Contact with and (suspected) exposure to covid-19: Secondary | ICD-10-CM | POA: Diagnosis not present

## 2022-04-07 DIAGNOSIS — Z20822 Contact with and (suspected) exposure to covid-19: Secondary | ICD-10-CM | POA: Diagnosis not present

## 2022-04-08 DIAGNOSIS — Z20822 Contact with and (suspected) exposure to covid-19: Secondary | ICD-10-CM | POA: Diagnosis not present

## 2022-04-11 DIAGNOSIS — Z20822 Contact with and (suspected) exposure to covid-19: Secondary | ICD-10-CM | POA: Diagnosis not present

## 2022-04-12 DIAGNOSIS — Z20822 Contact with and (suspected) exposure to covid-19: Secondary | ICD-10-CM | POA: Diagnosis not present

## 2022-05-11 ENCOUNTER — Ambulatory Visit: Payer: Medicare Other | Admitting: Family Medicine

## 2022-05-11 ENCOUNTER — Encounter: Payer: Self-pay | Admitting: Family Medicine

## 2022-05-11 ENCOUNTER — Ambulatory Visit (INDEPENDENT_AMBULATORY_CARE_PROVIDER_SITE_OTHER): Payer: Medicare Other | Admitting: Family Medicine

## 2022-05-11 VITALS — BP 144/88 | HR 65 | Temp 97.6°F | Ht 63.0 in | Wt 172.4 lb

## 2022-05-11 DIAGNOSIS — E7800 Pure hypercholesterolemia, unspecified: Secondary | ICD-10-CM

## 2022-05-11 DIAGNOSIS — L900 Lichen sclerosus et atrophicus: Secondary | ICD-10-CM | POA: Diagnosis not present

## 2022-05-11 DIAGNOSIS — Z136 Encounter for screening for cardiovascular disorders: Secondary | ICD-10-CM

## 2022-05-11 DIAGNOSIS — Z13220 Encounter for screening for lipoid disorders: Secondary | ICD-10-CM | POA: Diagnosis not present

## 2022-05-11 DIAGNOSIS — C44612 Basal cell carcinoma of skin of right upper limb, including shoulder: Secondary | ICD-10-CM

## 2022-05-11 DIAGNOSIS — C4491 Basal cell carcinoma of skin, unspecified: Secondary | ICD-10-CM | POA: Insufficient documentation

## 2022-05-11 DIAGNOSIS — G5600 Carpal tunnel syndrome, unspecified upper limb: Secondary | ICD-10-CM

## 2022-05-11 DIAGNOSIS — Z7689 Persons encountering health services in other specified circumstances: Secondary | ICD-10-CM | POA: Diagnosis not present

## 2022-05-11 DIAGNOSIS — L821 Other seborrheic keratosis: Secondary | ICD-10-CM | POA: Insufficient documentation

## 2022-05-11 DIAGNOSIS — E119 Type 2 diabetes mellitus without complications: Secondary | ICD-10-CM

## 2022-05-11 DIAGNOSIS — H2513 Age-related nuclear cataract, bilateral: Secondary | ICD-10-CM | POA: Insufficient documentation

## 2022-05-11 DIAGNOSIS — R030 Elevated blood-pressure reading, without diagnosis of hypertension: Secondary | ICD-10-CM | POA: Diagnosis not present

## 2022-05-11 DIAGNOSIS — L570 Actinic keratosis: Secondary | ICD-10-CM | POA: Insufficient documentation

## 2022-05-11 NOTE — Progress Notes (Signed)
New Patient Office Visit  Subjective    Patient ID: Maddy Wile, female    DOB: 1946-02-24  Age: 76 y.o. MRN: [id number]  CC:  Chief Complaint  Patient presents with   New Patient (Initial Visit)    Ptatient also requesting DM and cholesterol check     HPI Thayer Tunison presents to establish care  Pt reports she has hx of prediabetes and would like her A1c checked. She reports she was on Metformin last year but made her have rectal bleeding. She's been working on diet to control her diabetes.  Pt has hx of HLD and taking Pravastatin '20mg'$ . Tolerates this one well. Unable to take the other stronger statins.  She has never been on anything for HTN. She checks at home and it was running 120s SBP but last time checked was in May.  She's had labs done in May and has brought them here to the visit for my review. She is fasting today and would like her labs rechecked. She says her diet is good.  She has hx of BCC on her right thigh. Established with dermatology for screening. She has seen OB/GYN for lichen sclerosis. Using clobetasol prn for pruritus.  She has hx of eye exam. She reports she did Covid vaccines x 2. She denies flu vaccine, pneumonia, and shingrix vaccines. She says she is healthy and doesn't get sick.  She has hx of CTS on the right hand.  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  Outpatient Encounter Medications as of 05/11/2022  Medication Sig   Calcium Carbonate-Vitamin D (CALCIUM-VITAMIN D) 500-200 MG-UNIT per tablet Take 1 tablet by mouth 2 (two) times daily with a meal.   cholecalciferol (VITAMIN D) 1000 UNITS tablet Take 1 tablet by mouth daily.   clobetasol ointment (TEMOVATE) 6.22 % Apply 1 application topically 2 (two) times daily.   fish oil-omega-3 fatty acids 1000 MG capsule Take 1 g by mouth daily.   magnesium 30 MG tablet Take 30 mg by mouth 2 (two) times daily.   pravastatin (PRAVACHOL) 20 MG tablet Take 1 tablet  by mouth daily.   Turmeric 450 MG CAPS See admin instructions.   vitamin B-12 (CYANOCOBALAMIN) 100 MCG tablet Take 1 tablet by mouth daily.   [DISCONTINUED] ibuprofen (ADVIL,MOTRIN) 600 MG tablet Take 1 tablet (600 mg total) by mouth every 6 (six) hours as needed. (Patient not taking: Reported on 05/11/2022)   No facility-administered encounter medications on file as of 05/11/2022.    Past Medical History:  Diagnosis Date   Age-related nuclear cataract, bilateral    AK (actinic keratosis)    BCC (basal cell carcinoma of skin)    Hyperlipidemia    Hypertension    Lichen sclerosus    SK (seborrheic keratosis)    Type 2 diabetes mellitus ([place])     Past Surgical History:  Procedure Laterality Date   APPENDECTOMY     CESAREAN SECTION     COLONOSCOPY  11/02/2021   ENDOMETRIAL BIOPSY  04/14/2016   for PMB    Family History  Problem Relation Age of Onset   Cancer Mother    Diabetes Mother    Alcohol abuse Father    Birth defects Maternal Grandfather     Social History   Socioeconomic History   Marital status: Widowed    Spouse name: Not on file   Number of children: Not on file   Years of education: Not on file  Highest education level: Not on file  Occupational History   Not on file  Tobacco Use   Smoking status: Never   Smokeless tobacco: Never  Substance and Sexual Activity   Alcohol use: No   Drug use: No   Sexual activity: Never  Other Topics Concern   Not on file  Social History Narrative   Not on file   Social Determinants of Health   Financial Resource Strain: Not on file  Food Insecurity: Not on file  Transportation Needs: Not on file  Physical Activity: Not on file  Stress: Not on file  Social Connections: Not on file  Intimate Partner Violence: Not on file    Review of Systems  All other systems reviewed and are negative.       Objective    BP (!) 144/88   Pulse 65   Temp 97.6 F (36.4 C)   Ht '5\' 3"'$  (1.6 m)   Wt 172 lb 6 oz (78.2  kg)   SpO2 99%   BMI 30.53 kg/m   Physical Exam Vitals and nursing note reviewed.  Constitutional:      Appearance: Normal appearance. She is normal weight.  HENT:     Head: Normocephalic and atraumatic.     Right Ear: Tympanic membrane, ear canal and external ear normal.     Left Ear: Tympanic membrane, ear canal and external ear normal.     Nose: Nose normal.     Mouth/Throat:     Mouth: Mucous membranes are moist.     Pharynx: Oropharynx is clear.  Cardiovascular:     Rate and Rhythm: Normal rate and regular rhythm.     Pulses: Normal pulses.     Heart sounds: Normal heart sounds.  Pulmonary:     Effort: Pulmonary effort is normal.     Breath sounds: Normal breath sounds.  Abdominal:     General: Abdomen is flat. Bowel sounds are normal.  Musculoskeletal:     Cervical back: Normal range of motion and neck supple.  Skin:    General: Skin is warm and dry.     Capillary Refill: Capillary refill takes less than 2 seconds.  Neurological:     General: No focal deficit present.     Mental Status: She is alert and oriented to person, place, and time. Mental status is at baseline.        Assessment & Plan:   Problem List Items Addressed This Visit       Endocrine   Type 2 diabetes mellitus without complications ([place])   Relevant Orders   Comprehensive metabolic panel   Hemoglobin A1c     Musculoskeletal and Integument   BCC (basal cell carcinoma of skin)     Other   Lichen sclerosus   Pure hypercholesterolemia   Other Visit Diagnoses     Encounter to establish care with new doctor    -  Primary   Encounter for lipid screening for cardiovascular disease       Relevant Orders   Lipid panel     Chart updated today and reviewed old lab results To recheck lipid, A1c, and CMP today.  Continue with lifestyle and diet changes. See back in 3 months  Continue to monitor blood pressures at home. Recheck today has come down less than 150 SBP    No follow-ups on  file.   Leeanne Rio, MD

## 2022-05-12 ENCOUNTER — Telehealth: Payer: Self-pay | Admitting: Family Medicine

## 2022-05-12 ENCOUNTER — Ambulatory Visit: Payer: Medicare Other | Admitting: Family Medicine

## 2022-05-12 LAB — LIPID PANEL
Chol/HDL Ratio: 3.7 ratio (ref 0.0–4.4)
Cholesterol, Total: 188 mg/dL (ref 100–199)
HDL: 51 mg/dL (ref >39)
LDL Chol Calc (NIH): 119 mg/dL — ABNORMAL HIGH (ref 0–99)
Triglycerides: 101 mg/dL (ref 0–149)
VLDL Cholesterol Cal: 18 mg/dL (ref 5–40)

## 2022-05-12 LAB — HEMOGLOBIN A1C
Est. average glucose Bld gHb Est-mCnc: 157 mg/dL
Hgb A1c MFr Bld: 7.1 % — ABNORMAL HIGH (ref 4.8–5.6)

## 2022-05-12 LAB — COMPREHENSIVE METABOLIC PANEL
ALT: 18 IU/L (ref 0–32)
AST: 23 IU/L (ref 0–40)
Albumin/Globulin Ratio: 1.8 (ref 1.2–2.2)
Albumin: 4.6 g/dL (ref 3.8–4.8)
Alkaline Phosphatase: 88 IU/L (ref 44–121)
BUN/Creatinine Ratio: 20 (ref 12–28)
BUN: 16 mg/dL (ref 8–27)
Bilirubin Total: 0.5 mg/dL (ref 0.0–1.2)
CO2: 24 mmol/L (ref 20–29)
Calcium: 9.7 mg/dL (ref 8.7–10.3)
Chloride: 102 mmol/L (ref 96–106)
Creatinine, Ser: 0.79 mg/dL (ref 0.57–1.00)
Globulin, Total: 2.6 g/dL (ref 1.5–4.5)
Glucose: 123 mg/dL — ABNORMAL HIGH (ref 70–99)
Potassium: 4.4 mmol/L (ref 3.5–5.2)
Sodium: 140 mmol/L (ref 134–144)
Total Protein: 7.2 g/dL (ref 6.0–8.5)
eGFR: 77 mL/min/{1.73_m2} (ref >59)

## 2022-05-12 NOTE — Telephone Encounter (Addendum)
Please inform pt her A1c is up to 7.1. I understand she is unable to take Metformin. Would she be willing to start on another diabetic medicine to help bring her sugars and diabetes under better control?  Her cholesterol was also elevated with LDL at 119 from 74 test in March. She should increase her Pravastatin from '20mg'$  to '40mg'$  daily. She can start taking 2 of the 20 mg tabs Pravastatin.

## 2022-05-12 NOTE — Telephone Encounter (Deleted)
Her cholesterol was also elevated with LDL at 119 from 74 test in March. She should increase her Pravastatin from '20mg'$  to '40mg'$  daily. She can start taking 2 of the 20 mg tabs Pravastatin.

## 2022-05-12 NOTE — Telephone Encounter (Signed)
Patient informed and  states she will not start the DM medication- stating other doctor had told her that for her age goal is 7.5-7.7= she will start the pravastatin at '40mg'$  daily .

## 2022-05-12 NOTE — Telephone Encounter (Signed)
Attempted call to patient. Left voice mail message requesting a return call.  

## 2022-05-13 MED ORDER — PRAVASTATIN SODIUM 20 MG PO TABS: 30.0000 mg | ORAL_TABLET | Freq: Every day | ORAL | 3 refills | Status: DC

## 2022-05-13 NOTE — Addendum Note (Signed)
Addended by: Leeanne Rio on: 05/13/2022 09:03 AM   Modules accepted: Orders

## 2022-05-16 ENCOUNTER — Encounter: Payer: Self-pay | Admitting: Family Medicine

## 2022-05-16 ENCOUNTER — Telehealth: Payer: Self-pay | Admitting: Family Medicine

## 2022-05-16 ENCOUNTER — Ambulatory Visit (INDEPENDENT_AMBULATORY_CARE_PROVIDER_SITE_OTHER): Payer: Medicare Other | Admitting: Family Medicine

## 2022-05-16 DIAGNOSIS — Z111 Encounter for screening for respiratory tuberculosis: Secondary | ICD-10-CM

## 2022-05-16 NOTE — Telephone Encounter (Signed)
Patient informed and schld for a nurse visit for PPD placement.

## 2022-05-16 NOTE — Telephone Encounter (Signed)
I have completed the document. She will need to return for nurse visit TB skin test and reading, unless she's completed this elsewhere in the last 6 months?

## 2022-05-16 NOTE — Progress Notes (Signed)
   Established Patient Office Visit  Subjective   Patient ID: Jacqueline Clark, female    DOB: April 20, 1946  Age: 76 y.o. MRN: [id number]  Chief Complaint  Patient presents with   PPD Placement    Nurse visit    HPI  PPD placement- nurse visit .  ROS    Objective:     There were no vitals taken for this visit.   Physical Exam   No results found for any visits on 05/16/22.    The 10-year ASCVD risk score (Arnett DK, et al., 2019) is: 41.2%    Assessment & Plan:  PPD placed left forearm. Patient tolerated injection well without complications. Patient will return in 32  to 72 hours for PPD read nurse visit. Problem List Items Addressed This Visit   None Visit Diagnoses     Screening for tuberculosis    -  Primary   Relevant Orders   TB Skin Test (Completed)       Return in about 2 days (around 05/18/2022) for PPD read as nurse visit. Rae Lips, LPN

## 2022-05-16 NOTE — Telephone Encounter (Signed)
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 Patient provided extra health information to assist PCP in completing paperwork. Patient notified of wait time and potential fees, paperwork place in Dr. Roland Rack office. Jacqueline Clark

## 2022-05-16 NOTE — Patient Instructions (Signed)
Return in 48 to 72 hours for PPD read nurse visit.

## 2022-05-18 ENCOUNTER — Ambulatory Visit (INDEPENDENT_AMBULATORY_CARE_PROVIDER_SITE_OTHER): Payer: Medicare Other | Admitting: Family Medicine

## 2022-05-18 ENCOUNTER — Encounter: Payer: Self-pay | Admitting: Family Medicine

## 2022-05-18 VITALS — BP 126/73 | HR 68 | Ht 63.0 in | Wt 175.2 lb

## 2022-05-18 DIAGNOSIS — Z111 Encounter for screening for respiratory tuberculosis: Secondary | ICD-10-CM | POA: Diagnosis not present

## 2022-05-18 LAB — TB SKIN TEST
Induration: 0 mm
TB Skin Test: NEGATIVE

## 2022-05-18 NOTE — Progress Notes (Signed)
   Established Patient Office Visit  Subjective   Patient ID: Jacqueline Clark, female    DOB: 04/18/1946  Age: 76 y.o. MRN: [id number]  Chief Complaint  Patient presents with   PPD Reading    HPI PPD read only - nurse visit  ROS    Objective:     BP 126/73   Pulse 68   Ht '5\' 3"'$  (1.6 m)   Wt 175 lb 3 oz (79.5 kg)   SpO2 99%   BMI 31.03 kg/m    Physical Exam   No results found for any visits on 05/18/22.    The 10-year ASCVD risk score (Arnett DK, et al., 2019) is: 33.6%    Assessment & Plan:  PPD read only visit - negative results -22m induration -no redness. Problem List Items Addressed This Visit   None   No follow-ups on file.    KRae Lips LPN

## 2022-05-19 ENCOUNTER — Ambulatory Visit: Payer: Medicare Other

## 2022-06-15 ENCOUNTER — Telehealth: Payer: Self-pay | Admitting: Family Medicine

## 2022-06-15 NOTE — Telephone Encounter (Signed)
Patient called in to state she is covid positive, wanted to know if provider would like to see her. States she is not feeling too bad and is actually feeling quite well but wanted to know what PCP thinks. Please advise. Jacqueline Clark

## 2022-06-15 NOTE — Telephone Encounter (Signed)
She doesn't need to be seen for Covid if her symptoms are mild. She can treat the symptoms with OTC medication ie. Tylenol or advil as needed for fever or discomfort. Stay hydrated with plenty of fluids.  Will need to quarantine for 5 days with mask wearing as long as she has symptoms such as cough. If symptoms worsen can let us know.

## 2022-06-16 NOTE — Telephone Encounter (Signed)
Patient informed. 

## 2022-06-16 NOTE — Telephone Encounter (Signed)
Called- left voice mail reqeusting a return call.

## 2022-06-30 ENCOUNTER — Other Ambulatory Visit: Payer: Self-pay | Admitting: Family Medicine

## 2022-08-10 DIAGNOSIS — C4441 Basal cell carcinoma of skin of scalp and neck: Secondary | ICD-10-CM | POA: Diagnosis not present

## 2022-08-10 DIAGNOSIS — Z129 Encounter for screening for malignant neoplasm, site unspecified: Secondary | ICD-10-CM | POA: Diagnosis not present

## 2022-08-10 DIAGNOSIS — D2372 Other benign neoplasm of skin of left lower limb, including hip: Secondary | ICD-10-CM | POA: Diagnosis not present

## 2022-08-10 DIAGNOSIS — L821 Other seborrheic keratosis: Secondary | ICD-10-CM | POA: Diagnosis not present

## 2022-08-10 DIAGNOSIS — Z85828 Personal history of other malignant neoplasm of skin: Secondary | ICD-10-CM | POA: Diagnosis not present

## 2022-08-17 ENCOUNTER — Other Ambulatory Visit: Payer: Self-pay

## 2022-08-17 ENCOUNTER — Ambulatory Visit (INDEPENDENT_AMBULATORY_CARE_PROVIDER_SITE_OTHER): Payer: Medicare Other | Admitting: Family Medicine

## 2022-08-17 ENCOUNTER — Encounter: Payer: Self-pay | Admitting: Family Medicine

## 2022-08-17 ENCOUNTER — Ambulatory Visit: Payer: Medicare Other | Admitting: Family Medicine

## 2022-08-17 VITALS — BP 140/76 | HR 72 | Temp 97.6°F | Ht 63.0 in | Wt 173.2 lb

## 2022-08-17 DIAGNOSIS — E119 Type 2 diabetes mellitus without complications: Secondary | ICD-10-CM | POA: Diagnosis not present

## 2022-08-17 DIAGNOSIS — C4441 Basal cell carcinoma of skin of scalp and neck: Secondary | ICD-10-CM | POA: Diagnosis not present

## 2022-08-17 DIAGNOSIS — L905 Scar conditions and fibrosis of skin: Secondary | ICD-10-CM | POA: Diagnosis not present

## 2022-08-17 LAB — POCT GLYCOSYLATED HEMOGLOBIN (HGB A1C): Hemoglobin A1C: 7 % — AB (ref 4.0–5.6)

## 2022-08-17 NOTE — Progress Notes (Signed)
   Established Patient Office Visit  Subjective   Patient ID: Jacqueline Clark, female    DOB: Jan 06, 1946  Age: 77 y.o. MRN: [id number]  Chief Complaint  Patient presents with   Diabetes   Hypertension    Diabetes  Hypertension   Diabetes Pt is diet controlled. She was started on Jardiance in the past and had side effects. She refuses to use or take anything for the diabetes. She is here for recheck. She is on a statin.   She is seeing Dr Tamala Julian dermatology and has a lesion on her chest that is cancerous. She is having it removed today at 2:30.  Pt monitoring blood pressure. Between 130-140 and she checks at [facility].  Review of Systems  All other systems reviewed and are negative.     Objective:     BP (!) 140/76   Pulse 72   Temp 97.6 F (36.4 C) (Oral)   Ht '5\' 3"'$  (1.6 m)   Wt 173 lb 3.2 oz (78.6 kg)   SpO2 99%   BMI 30.68 kg/m    Physical Exam Vitals and nursing note reviewed.  Constitutional:      Appearance: Normal appearance. She is normal weight.  HENT:     Head: Normocephalic and atraumatic.     Right Ear: External ear normal.     Left Ear: External ear normal.     Nose: Nose normal.     Mouth/Throat:     Mouth: Mucous membranes are moist.     Pharynx: Oropharynx is clear.  Cardiovascular:     Rate and Rhythm: Normal rate and regular rhythm.     Pulses: Normal pulses.     Heart sounds: Normal heart sounds.  Pulmonary:     Effort: Pulmonary effort is normal.     Breath sounds: Normal breath sounds.  Skin:    General: Skin is warm.     Capillary Refill: Capillary refill takes less than 2 seconds.  Neurological:     General: No focal deficit present.     Mental Status: She is alert and oriented to person, place, and time. Mental status is at baseline.  Psychiatric:        Mood and Affect: Mood normal.        Behavior: Behavior normal.        Thought Content: Thought content normal.        Judgment: Judgment normal.     No results found for any  visits on 08/17/22.    The 10-year ASCVD risk score (Arnett DK, et al., 2019) is: 39.5%    Assessment & Plan:   Problem List Items Addressed This Visit       Endocrine   Type 2 diabetes mellitus without complications ([place]) - Primary   Relevant Orders   POCT glycosylated hemoglobin (Hb A1C)   A1c is 7.0. She is still refusing any diabetic medicine and would like to continue to watch her diet.  Continue to follow and monitor her blood pressure.   Return in about 3 months (around 11/17/2022) for Sub AWV.    Leeanne Rio, MD

## 2022-08-17 NOTE — Addendum Note (Signed)
Addended by: Jerrel Ivory D on: 08/17/2022 08:33 AM   Modules accepted: Orders

## 2022-08-18 ENCOUNTER — Telehealth: Payer: Self-pay | Admitting: Family Medicine

## 2022-08-18 NOTE — Telephone Encounter (Signed)
Patient came in to drop off paperwork for handicap application,. Patient aware of time needed to complete paperwork and potential fees. Patient requested notice at number on file upon completion. Paperwork placed in provider box. Jacqueline Clark

## 2022-08-22 NOTE — Telephone Encounter (Signed)
Brought form to front desk and called patient to notify her that form is completed and ready for pickup. Jacqueline Clark

## 2022-08-29 DIAGNOSIS — C4441 Basal cell carcinoma of skin of scalp and neck: Secondary | ICD-10-CM | POA: Diagnosis not present

## 2022-10-12 DIAGNOSIS — H5203 Hypermetropia, bilateral: Secondary | ICD-10-CM | POA: Diagnosis not present

## 2022-10-12 DIAGNOSIS — H524 Presbyopia: Secondary | ICD-10-CM | POA: Diagnosis not present

## 2022-10-12 LAB — HM DIABETES EYE EXAM

## 2022-10-27 ENCOUNTER — Other Ambulatory Visit: Payer: Self-pay | Admitting: Family Medicine

## 2022-10-27 DIAGNOSIS — E7800 Pure hypercholesterolemia, unspecified: Secondary | ICD-10-CM

## 2022-11-17 ENCOUNTER — Encounter: Payer: Self-pay | Admitting: Family Medicine

## 2022-11-17 ENCOUNTER — Ambulatory Visit (INDEPENDENT_AMBULATORY_CARE_PROVIDER_SITE_OTHER): Payer: Medicare Other | Admitting: Family Medicine

## 2022-11-17 ENCOUNTER — Ambulatory Visit: Payer: Medicare Other | Admitting: Family Medicine

## 2022-11-17 VITALS — BP 136/78 | HR 75 | Temp 97.6°F | Resp 18 | Ht 63.0 in | Wt 173.1 lb

## 2022-11-17 DIAGNOSIS — E119 Type 2 diabetes mellitus without complications: Secondary | ICD-10-CM | POA: Diagnosis not present

## 2022-11-17 DIAGNOSIS — M47818 Spondylosis without myelopathy or radiculopathy, sacral and sacrococcygeal region: Secondary | ICD-10-CM

## 2022-11-17 DIAGNOSIS — B351 Tinea unguium: Secondary | ICD-10-CM

## 2022-11-17 DIAGNOSIS — Z0000 Encounter for general adult medical examination without abnormal findings: Secondary | ICD-10-CM

## 2022-11-17 DIAGNOSIS — E782 Mixed hyperlipidemia: Secondary | ICD-10-CM | POA: Diagnosis not present

## 2022-11-17 MED ORDER — TERBINAFINE HCL 250 MG PO TABS
250.0000 mg | ORAL_TABLET | Freq: Every day | ORAL | 0 refills | Status: DC
Start: 2022-11-17 — End: 2022-12-08

## 2022-11-17 NOTE — Progress Notes (Signed)
Annual Wellness Visit     Patient: Jacqueline Clark, Female    DOB: Apr 09, 1946, 77 y.o.   MRN: [id number]  Subjective  Chief Complaint  Patient presents with   Medicare Annual wellness visit     Patient is here for medicare annual wellness visit    Jacqueline Clark is a 77 y.o. female who presents today for her Annual Wellness Visit. Jacqueline Clark reports consuming a  diabetic  diet.  Not playing pickleball as much or bowling  Jacqueline Clark generally feels well. Jacqueline Clark reports sleeping well. Jacqueline Clark does have additional problems to discuss today.   HPI  Pt reports Jacqueline Clark continues to have nail fungus just on the right foot. Been present for a while. Would like to see podiatrist. Jacqueline Clark has had diabetic eye exam Oct 12 2022 Pt reports right sciatic joint pain. Stopped bowling and pain resolved.   Vision:Within last year and Dental: No current dental problems and Last dental visit: May 8. 2024    Opioid Use/abuse: No use  Medications: Outpatient Medications Prior to Visit  Medication Sig   cholecalciferol (VITAMIN D) 1000 UNITS tablet Take 1 tablet by mouth daily.   fish oil-omega-3 fatty acids 1000 MG capsule Take 1 g by mouth daily.   pravastatin (PRAVACHOL) 20 MG tablet TAKE 1 AND 1/2 TABLETS DAILY BY MOUTH   Turmeric 450 MG CAPS See admin instructions.   Zinc 50 MG TABS Take by mouth.   Calcium Carbonate-Vitamin D (CALCIUM-VITAMIN D) 500-200 MG-UNIT per tablet Take 1 tablet by mouth 2 (two) times daily with a meal. (Patient not taking: Reported on 08/17/2022)   magnesium 30 MG tablet Take 30 mg by mouth 2 (two) times daily. (Patient not taking: Reported on 08/17/2022)   No facility-administered medications prior to visit.    Allergies  Allergen Reactions   Atorvastatin Other (See Comments)   Lovastatin Other (See Comments)   Metformin Other (See Comments)   Cashew Nut Oil    Demerol [Meperidine] Other (See Comments)    confusion   Family History  Problem Relation Age of Onset   Cancer Mother    Diabetes  Mother    Alcohol abuse Father    Birth defects Maternal Grandfather    Cancer - Ovarian Other     Patient Care Team: Suzan Slick, MD as PCP - General (Family Medicine) Lesly Dukes, MD as Consulting Physician (Obstetrics and Gynecology) [facility]  Review of Systems  Musculoskeletal:  Positive for back pain.  Skin:        Nail fungus right foot  All other systems reviewed and are negative.       Objective  BP (!) 144/69   Pulse 75   Temp 97.6 F (36.4 C) (Oral)   Resp 18   Ht 5\' 3"  (1.6 m)   Wt 173 lb 1.6 oz (78.5 kg)   SpO2 98%   BMI 30.66 kg/m     Physical Exam Vitals and nursing note reviewed.  Constitutional:      Appearance: Normal appearance. Jacqueline Clark is normal weight.  HENT:     Head: Normocephalic and atraumatic.     Right Ear: External ear normal.     Left Ear: External ear normal.     Nose: Nose normal.     Mouth/Throat:     Mouth: Mucous membranes are moist.  Eyes:     Conjunctiva/sclera: Conjunctivae normal.     Pupils: Pupils are equal, round, and reactive to light.  Cardiovascular:  Rate and Rhythm: Normal rate and regular rhythm.     Pulses: Normal pulses.     Heart sounds: Normal heart sounds.  Pulmonary:     Effort: Pulmonary effort is normal.     Breath sounds: Normal breath sounds.  Abdominal:     General: Abdomen is flat. Bowel sounds are normal.  Skin:    General: Skin is warm.     Capillary Refill: Capillary refill takes less than 2 seconds.  Neurological:     General: No focal deficit present.     Mental Status: Jacqueline Clark is alert and oriented to person, place, and time. Mental status is at baseline.  Psychiatric:        Mood and Affect: Mood normal.        Behavior: Behavior normal.        Thought Content: Thought content normal.        Judgment: Judgment normal.     Most recent functional status assessment:     No data to display         Most recent fall risk assessment:    05/11/2022     9:17 AM  Fall Risk   Falls in the past year? 0  Number falls in past yr: 0  Injury with Fall? 0  Risk for fall due to : No Fall Risks  Follow up Falls evaluation completed    Most recent depression screenings:    05/11/2022    9:17 AM 08/26/2013   11:25 AM  PHQ 2/9 Scores  PHQ - 2 Score 0 0  PHQ- 9 Score 0    Most recent cognitive screening:     No data to display         Most recent Audit-C alcohol use screening     No data to display         A score of 3 or more in women, and 4 or more in men indicates increased risk for alcohol abuse, EXCEPT if all of the points are from question 1   Vision/Hearing Screen: No results found.    No results found for any visits on 11/17/22.    Assessment & Plan   Annual wellness visit done today including the all of the following: Reviewed patient's Family Medical History Reviewed and updated list of patient's medical providers Assessment of cognitive impairment was done Assessed patient's functional ability Established a written schedule for health screening services Health Risk Assessent Completed and Reviewed  Exercise Activities and Dietary recommendations  Goals   None     Immunization History  Administered Date(s) Administered   Influenza-Unspecified 03/15/2018   PFIZER(Purple Top)SARS-COV-2 Vaccination 01/07/2020, 01/28/2020, 07/08/2020   PPD Test 08/23/2013, 05/16/2022   Pneumococcal Conjugate-13 12/05/2014   Pneumococcal Polysaccharide-23 03/26/2012, 07/20/2016   Tdap 07/20/2016    Health Maintenance  Topic Date Due   OPHTHALMOLOGY EXAM  Never done   Diabetic kidney evaluation - Urine ACR  Never done   Hepatitis C Screening  Never done   Zoster Vaccines- Shingrix (1 of 2) Never done   COVID-19 Vaccine (4 - 2023-24 season) 02/11/2022   INFLUENZA VACCINE  01/12/2023   HEMOGLOBIN A1C  02/17/2023   Diabetic kidney evaluation - eGFR measurement  05/12/2023   FOOT EXAM  05/12/2023   Medicare Annual  Wellness (AWV)  11/17/2023   DTaP/Tdap/Td (2 - Td or Tdap) 07/20/2026   Pneumonia Vaccine 71+ Years old  Completed   DEXA SCAN  Completed   HPV VACCINES  Aged Out  Colonoscopy  Discontinued     Discussed health benefits of physical activity, and encouraged her to engage in regular exercise appropriate for her age and condition.    Medicare annual wellness visit, subsequent  Mixed hyperlipidemia -     Lipid panel  Type 2 diabetes mellitus without complication, without long-term current use of insulin (HCC) -     Hemoglobin A1c -     Microalbumin / creatinine urine ratio -     Comprehensive metabolic panel  Onychomycosis of great toe -     Terbinafine HCl; Take 1 tablet (250 mg total) by mouth daily.  Dispense: 30 tablet; Refill: 0 -     Comprehensive metabolic panel -     Ambulatory referral to Podiatry  SI joint arthritis   Screening labs Terbinafine 250mg  daily x 4 weeks for onychomycosis of right foot toenails Refer to podiatry SI joint pain likely arthritis. Resolved when resting from bowling. Discussed home care instructions.    No follow-ups on file.     Suzan Slick, MD

## 2022-11-18 ENCOUNTER — Other Ambulatory Visit: Payer: Self-pay | Admitting: Family Medicine

## 2022-11-18 ENCOUNTER — Encounter: Payer: Self-pay | Admitting: Family Medicine

## 2022-11-18 DIAGNOSIS — E119 Type 2 diabetes mellitus without complications: Secondary | ICD-10-CM

## 2022-11-18 LAB — COMPREHENSIVE METABOLIC PANEL
ALT: 20 IU/L (ref 0–32)
AST: 22 IU/L (ref 0–40)
Albumin/Globulin Ratio: 1.7 (ref 1.2–2.2)
Albumin: 4.5 g/dL (ref 3.8–4.8)
Alkaline Phosphatase: 82 IU/L (ref 44–121)
BUN/Creatinine Ratio: 20 (ref 12–28)
BUN: 16 mg/dL (ref 8–27)
Bilirubin Total: 0.4 mg/dL (ref 0.0–1.2)
CO2: 23 mmol/L (ref 20–29)
Calcium: 9.7 mg/dL (ref 8.7–10.3)
Chloride: 103 mmol/L (ref 96–106)
Creatinine, Ser: 0.79 mg/dL (ref 0.57–1.00)
Globulin, Total: 2.6 g/dL (ref 1.5–4.5)
Glucose: 130 mg/dL — ABNORMAL HIGH (ref 70–99)
Potassium: 4.4 mmol/L (ref 3.5–5.2)
Sodium: 140 mmol/L (ref 134–144)
Total Protein: 7.1 g/dL (ref 6.0–8.5)
eGFR: 77 mL/min/{1.73_m2} (ref >59)

## 2022-11-18 LAB — MICROALBUMIN / CREATININE URINE RATIO
Creatinine, Urine: 170.8 mg/dL
Microalb/Creat Ratio: 7 mg/g creat (ref 0–29)
Microalbumin, Urine: 12.4 ug/mL

## 2022-11-18 LAB — HEMOGLOBIN A1C
Est. average glucose Bld gHb Est-mCnc: 166 mg/dL
Hgb A1c MFr Bld: 7.4 % — ABNORMAL HIGH (ref 4.8–5.6)

## 2022-11-18 LAB — LIPID PANEL
Chol/HDL Ratio: 3.4 ratio (ref 0.0–4.4)
Cholesterol, Total: 158 mg/dL (ref 100–199)
HDL: 47 mg/dL (ref >39)
LDL Chol Calc (NIH): 90 mg/dL (ref 0–99)
Triglycerides: 114 mg/dL (ref 0–149)
VLDL Cholesterol Cal: 21 mg/dL (ref 5–40)

## 2022-11-21 ENCOUNTER — Encounter: Payer: Medicare Other | Admitting: Family Medicine

## 2022-12-08 ENCOUNTER — Encounter: Payer: Self-pay | Admitting: Podiatry

## 2022-12-08 ENCOUNTER — Ambulatory Visit (INDEPENDENT_AMBULATORY_CARE_PROVIDER_SITE_OTHER): Payer: Medicare Other | Admitting: Podiatry

## 2022-12-08 DIAGNOSIS — B351 Tinea unguium: Secondary | ICD-10-CM | POA: Diagnosis not present

## 2022-12-08 MED ORDER — TERBINAFINE HCL 250 MG PO TABS
250.0000 mg | ORAL_TABLET | Freq: Every day | ORAL | 1 refills | Status: AC
Start: 2022-12-08 — End: 2023-02-06

## 2022-12-08 NOTE — Progress Notes (Signed)
  Subjective:  Patient ID: Jacqueline Clark, female    DOB: 1946/05/26,   MRN: [id number]  Chief Complaint  Patient presents with   Nail Problem     Possible left foot fungus     77 y.o. female presents for concern of right foot nail fungus that has been present for a while. She is currently taking lamisil and been on for three weeks. She is also using OTC clotrimazole to help with the fungus. Not sure if seen any improvement yet. Relates she does have a history of diabetes and last A1c was 7.4  . Denies any other pedal complaints. Denies n/v/f/c.   Past Medical History:  Diagnosis Date   Age-related nuclear cataract, bilateral    AK (actinic keratosis)    BCC (basal cell carcinoma of skin)    Carpal tunnel syndrome    Hyperlipidemia    Hypertension    Lichen sclerosus    SK (seborrheic keratosis)    Type 2 diabetes mellitus (HCC)     Objective:  Physical Exam: Vascular: DP/PT pulses 2/4 bilateral. CFT <3 seconds. Normal hair growth on digits. No edema.  Skin. No lacerations or abrasions bilateral feet. Right foor nails 1-5 are thickened dystrophic and discolored with subungual debris. Some scaling noted to the plantar right foot as well.  Musculoskeletal: MMT 5/5 bilateral lower extremities in DF, PF, Inversion and Eversion. Deceased ROM in DF of ankle joint.  Neurological: Sensation intact to light touch.   Assessment:   1. Onychomycosis      Plan:  Patient was evaluated and treated and all questions answered. -Examined patient Reviewed notes from PCP.  Reviwed CMP and liver function in normal limits.  -Discussed treatment options for painful dystrophic nails  -Clinical picture and Fungal culture was obtained by removing a portion of the hard nail itself from each of the involved toenails using a sterile nail nipper and sent to [facility] lab. Patient tolerated the biopsy procedure well without discomfort or need for anesthesia.  -Discussed fungal nail treatment options including  oral, topical, and laser treatments.  Continue the lamisil and refilled for 60 more days to get a 90 day course. Continue with the clotrimazole.  -Patient to return in 4 weeks for follow up evaluation and discussion of fungal culture results or sooner if symptoms worsen.   Louann Sjogren, DPM

## 2022-12-08 NOTE — Addendum Note (Signed)
Addended by: Hedy Jacob on: 12/08/2022 03:42 PM   Modules accepted: Orders

## 2023-01-06 ENCOUNTER — Ambulatory Visit (INDEPENDENT_AMBULATORY_CARE_PROVIDER_SITE_OTHER): Payer: Medicare Other | Admitting: Podiatry

## 2023-01-06 ENCOUNTER — Encounter: Payer: Self-pay | Admitting: Podiatry

## 2023-01-06 DIAGNOSIS — B351 Tinea unguium: Secondary | ICD-10-CM | POA: Diagnosis not present

## 2023-01-06 MED ORDER — CLOTRIMAZOLE-BETAMETHASONE 1-0.05 % EX CREA: 1.0000 | TOPICAL_CREAM | Freq: Every day | CUTANEOUS | 0 refills | Status: DC

## 2023-01-06 NOTE — Progress Notes (Signed)
  Subjective:  Patient ID: Jacqueline Clark, female    DOB: Oct 24, 1945,   MRN: [id number]  Chief Complaint  Patient presents with   Nail Problem    Pt is here after fungus treatment she states her nails are getting better and she has no concerns at the moment     77 y.o. female presents for follow-up of fungus and to review culture results.  She is currently taking lamisil and been on for 7 weeks now. She is also using OTC clotrimazole to help with the fungus. Relates she does have a history of diabetes and last A1c was 7.4  . Denies any other pedal complaints. Denies n/v/f/c.   Past Medical History:  Diagnosis Date   Age-related nuclear cataract, bilateral    AK (actinic keratosis)    BCC (basal cell carcinoma of skin)    Carpal tunnel syndrome    Hyperlipidemia    Hypertension    Lichen sclerosus    SK (seborrheic keratosis)    Type 2 diabetes mellitus (HCC)     Objective:  Physical Exam: Vascular: DP/PT pulses 2/4 bilateral. CFT <3 seconds. Normal hair growth on digits. No edema.  Skin. No lacerations or abrasions bilateral feet. Right foor nails 1-5 are thickened dystrophic and discolored with subungual debris. There is some clearing noted to the proximal nail folds.  Some scaling noted to the plantar right foot as well.  Musculoskeletal: MMT 5/5 bilateral lower extremities in DF, PF, Inversion and Eversion. Deceased ROM in DF of ankle joint.  Neurological: Sensation intact to light touch.   Assessment:   1. Onychomycosis       Plan:  Patient was evaluated and treated and all questions answered. -Examined patient Reviewed notes from PCP.  Reviwed CMP and liver function in normal limits.  -Discussed treatment options for painful dystrophic nails  -Culture is positive for fungus with some trauma noted.  -Discussed fungal nail treatment options including oral, topical, and laser treatments.  Continue the Lamisil Continue with the clotrimazole.  -Refill for clotrimazole  provided.  -Patient to return in 3 months for recheck.    Louann Sjogren, DPM

## 2023-01-12 ENCOUNTER — Other Ambulatory Visit: Payer: Self-pay | Admitting: Podiatry

## 2023-01-13 ENCOUNTER — Other Ambulatory Visit: Payer: Self-pay | Admitting: Podiatry

## 2023-01-13 MED ORDER — CLOBETASOL PROPIONATE 0.05 % EX OINT: 1.0000 | TOPICAL_OINTMENT | Freq: Two times a day (BID) | CUTANEOUS | 0 refills | Status: AC

## 2023-02-06 ENCOUNTER — Encounter: Payer: Self-pay | Admitting: Obstetrics & Gynecology

## 2023-02-06 ENCOUNTER — Ambulatory Visit (INDEPENDENT_AMBULATORY_CARE_PROVIDER_SITE_OTHER): Payer: Medicare Other | Admitting: Obstetrics & Gynecology

## 2023-02-06 VITALS — BP 147/79 | HR 67 | Ht 63.0 in | Wt 171.0 lb

## 2023-02-06 DIAGNOSIS — N644 Mastodynia: Secondary | ICD-10-CM

## 2023-02-06 DIAGNOSIS — Z803 Family history of malignant neoplasm of breast: Secondary | ICD-10-CM

## 2023-02-06 NOTE — Progress Notes (Signed)
Right breast pain. 

## 2023-02-06 NOTE — Progress Notes (Signed)
   Subjective:    Patient ID: Jacqueline Clark, female    DOB: 11-01-1945, 77 y.o.   MRN: [id number]  HPI  77 yo female presents for right breast pain.  Pt has extensive family history of breast cancer including sister and a niece who died of breast cancer at 3 (triple negative).  Niece is positive a "colon cancer" gene; Mom had ovarian cancer, Uterine cancer in 40s   Review of Systems  Constitutional: Negative.   Respiratory: Negative.    Cardiovascular: Negative.   Gastrointestinal: Negative.   Genitourinary: Negative.        Right breast pain for 6 months       Objective:   Physical Exam Vitals reviewed.  Constitutional:      General: She is not in acute distress.    Appearance: She is well-developed.  HENT:     Head: Normocephalic and atraumatic.  Eyes:     Conjunctiva/sclera: Conjunctivae normal.  Cardiovascular:     Rate and Rhythm: Normal rate.  Pulmonary:     Effort: Pulmonary effort is normal.  Chest:  Breasts:    Tanner Score is 5.     Right: Tenderness present.     Left: Normal.    Skin:    General: Skin is warm and dry.  Neurological:     Mental Status: She is alert and oriented to person, place, and time.  Psychiatric:        Mood and Affect: Mood normal.    Vitals:   02/06/23 0902 02/06/23 0943  BP: (!) 146/84 (!) 147/79  Pulse: 73 67  Weight: 171 lb (77.6 kg)   Height: 5\' 3"  (1.6 m)       Assessment & Plan:    Genetic testing Diagnositic mammogram righ breast--upper outer quadrant RTX for vulvar/annual exam for LS F/U with PCP for HTN

## 2023-02-10 ENCOUNTER — Ambulatory Visit: Payer: Medicare Other | Admitting: Family Medicine

## 2023-02-10 ENCOUNTER — Other Ambulatory Visit (INDEPENDENT_AMBULATORY_CARE_PROVIDER_SITE_OTHER): Payer: Medicare Other | Admitting: Family Medicine

## 2023-02-10 VITALS — BP 113/68 | HR 74 | Temp 98.7°F | Resp 16 | Ht 63.0 in | Wt 167.0 lb

## 2023-02-10 DIAGNOSIS — E119 Type 2 diabetes mellitus without complications: Secondary | ICD-10-CM | POA: Diagnosis not present

## 2023-02-10 LAB — POCT GLYCOSYLATED HEMOGLOBIN (HGB A1C): Hemoglobin A1C: 6.2 % — AB (ref 4.0–5.6)

## 2023-02-10 NOTE — Progress Notes (Signed)
Jacqueline Clark presented today for POC A1C to be drawn before labor Day weekend. Last drawn on 11/17/22. Ordered from PCP to be done on 02/18/23. Insisted to get it drawn today. Informed patient she may get a bill from [employer].

## 2023-02-10 NOTE — Progress Notes (Signed)
Pt is here for for Hemoglobin A1c check,Last A1c was 7.4 on 6/6/624, pt was advised /informed that the 3 mo threshold for another A1c check was not due until 02/17/23 as advised by her pcp, pt was insitant to have A1c check and was agreeable to a possible bill.No changes  medications or allergies, pt has had a 6 lb weight loss and has made moderations to diet and exercise.

## 2023-02-11 ENCOUNTER — Other Ambulatory Visit: Payer: Self-pay | Admitting: Podiatry

## 2023-02-11 DIAGNOSIS — B351 Tinea unguium: Secondary | ICD-10-CM

## 2023-02-14 ENCOUNTER — Encounter: Payer: Self-pay | Admitting: Obstetrics & Gynecology

## 2023-02-15 DIAGNOSIS — Z85828 Personal history of other malignant neoplasm of skin: Secondary | ICD-10-CM | POA: Diagnosis not present

## 2023-02-15 DIAGNOSIS — L821 Other seborrheic keratosis: Secondary | ICD-10-CM | POA: Diagnosis not present

## 2023-02-15 DIAGNOSIS — Z129 Encounter for screening for malignant neoplasm, site unspecified: Secondary | ICD-10-CM | POA: Diagnosis not present

## 2023-02-22 ENCOUNTER — Other Ambulatory Visit: Payer: Self-pay | Admitting: Podiatry

## 2023-02-22 DIAGNOSIS — B351 Tinea unguium: Secondary | ICD-10-CM

## 2023-02-23 ENCOUNTER — Ambulatory Visit
Admission: RE | Admit: 2023-02-23 | Discharge: 2023-02-23 | Disposition: A | Discharge status: Home / Self Care | Payer: Medicare Other | Source: Ambulatory Visit | Attending: Obstetrics & Gynecology | Admitting: Obstetrics & Gynecology

## 2023-02-23 ENCOUNTER — Telehealth: Payer: Self-pay | Admitting: Podiatry

## 2023-02-23 DIAGNOSIS — N644 Mastodynia: Secondary | ICD-10-CM

## 2023-02-23 NOTE — Telephone Encounter (Signed)
Left detailed message for pt of what Dr Ralene Cork said and asked pt to call if any further questions.

## 2023-02-23 NOTE — Telephone Encounter (Signed)
Pt called upset stating she has been trying to get her medication refilled for over a week and has not been able to get thru on the phone. She said the pharmacy said you denied the refills. She said she is concerned that her treatment has been disrupted. She has appt with you in October.Please advise. I did apologize for the delay.

## 2023-02-27 ENCOUNTER — Ambulatory Visit: Payer: Medicare Other | Admitting: Obstetrics & Gynecology

## 2023-02-28 LAB — EMPOWER BRCA (2): REPORT SUMMARY: NEGATIVE

## 2023-03-05 ENCOUNTER — Encounter: Payer: Self-pay | Admitting: Obstetrics & Gynecology

## 2023-03-05 DIAGNOSIS — Z803 Family history of malignant neoplasm of breast: Secondary | ICD-10-CM | POA: Insufficient documentation

## 2023-03-06 ENCOUNTER — Encounter: Payer: Self-pay | Admitting: Podiatry

## 2023-03-07 ENCOUNTER — Other Ambulatory Visit: Payer: Medicare Other

## 2023-04-06 ENCOUNTER — Ambulatory Visit (INDEPENDENT_AMBULATORY_CARE_PROVIDER_SITE_OTHER): Payer: Medicare Other | Admitting: Podiatry

## 2023-04-06 DIAGNOSIS — B351 Tinea unguium: Secondary | ICD-10-CM

## 2023-04-06 NOTE — Progress Notes (Signed)
  Subjective:  Patient ID: Jacqueline Clark, female    DOB: 08-Jul-1945,   MRN: [id number]  No chief complaint on file.   77 y.o. female presents for follow-up of fungus. Relates doing well and has finished the lamisil. Has been using the clotrimazole.  Relates she does have a history of diabetes and last A1c was 7.4  . Denies any other pedal complaints. Denies n/v/f/c.   Past Medical History:  Diagnosis Date   Age-related nuclear cataract, bilateral    AK (actinic keratosis)    BCC (basal cell carcinoma of skin)    Carpal tunnel syndrome    Hyperlipidemia    Hypertension    Lichen sclerosus    SK (seborrheic keratosis)    Type 2 diabetes mellitus (HCC)     Objective:  Physical Exam: Vascular: DP/PT pulses 2/4 bilateral. CFT <3 seconds. Normal hair growth on digits. No edema.  Skin. No lacerations or abrasions bilateral feet. Right foot nails 1-5 are thickened dystrophic and discolored with subungual debris. There is more clearing noted to the proximal nail folds. Nails near normal appearing. .  Scaling improved.  Musculoskeletal: MMT 5/5 bilateral lower extremities in DF, PF, Inversion and Eversion. Deceased ROM in DF of ankle joint.  Neurological: Sensation intact to light touch.   Assessment:   1. Onychomycosis        Plan:  Patient was evaluated and treated and all questions answered. -Examined patient  -Discussed treatment options for painful dystrophic nails  -Culture is positive for fungus with some trauma noted.  -Discussed fungal nail treatment options including oral, topical, and laser treatments.  May discontinue clortrimazole and no need for more lamisil. Feel we have cure.  -Return as needed   Louann Sjogren, DPM

## 2023-04-10 ENCOUNTER — Other Ambulatory Visit: Payer: Self-pay | Admitting: Podiatry

## 2023-04-10 DIAGNOSIS — B351 Tinea unguium: Secondary | ICD-10-CM

## 2023-05-07 ENCOUNTER — Other Ambulatory Visit: Payer: Self-pay | Admitting: Family Medicine

## 2023-05-07 DIAGNOSIS — E7800 Pure hypercholesterolemia, unspecified: Secondary | ICD-10-CM

## 2023-08-16 DIAGNOSIS — Z85828 Personal history of other malignant neoplasm of skin: Secondary | ICD-10-CM | POA: Diagnosis not present

## 2023-08-16 DIAGNOSIS — L718 Other rosacea: Secondary | ICD-10-CM | POA: Diagnosis not present

## 2023-08-16 DIAGNOSIS — L821 Other seborrheic keratosis: Secondary | ICD-10-CM | POA: Diagnosis not present

## 2023-08-16 DIAGNOSIS — Z129 Encounter for screening for malignant neoplasm, site unspecified: Secondary | ICD-10-CM | POA: Diagnosis not present

## 2023-09-10 ENCOUNTER — Other Ambulatory Visit: Payer: Self-pay | Admitting: Family Medicine

## 2023-09-10 DIAGNOSIS — E7800 Pure hypercholesterolemia, unspecified: Secondary | ICD-10-CM

## 2023-11-07 DIAGNOSIS — E119 Type 2 diabetes mellitus without complications: Secondary | ICD-10-CM | POA: Diagnosis not present

## 2023-11-07 DIAGNOSIS — E782 Mixed hyperlipidemia: Secondary | ICD-10-CM | POA: Diagnosis not present

## 2023-11-07 DIAGNOSIS — I10 Essential (primary) hypertension: Secondary | ICD-10-CM | POA: Diagnosis not present

## 2023-11-07 DIAGNOSIS — R42 Dizziness and giddiness: Secondary | ICD-10-CM | POA: Diagnosis not present

## 2023-11-07 DIAGNOSIS — Z6830 Body mass index (BMI) 30.0-30.9, adult: Secondary | ICD-10-CM | POA: Diagnosis not present

## 2023-11-14 DIAGNOSIS — Z1331 Encounter for screening for depression: Secondary | ICD-10-CM | POA: Diagnosis not present

## 2023-11-14 DIAGNOSIS — E119 Type 2 diabetes mellitus without complications: Secondary | ICD-10-CM | POA: Diagnosis not present

## 2023-11-14 DIAGNOSIS — E785 Hyperlipidemia, unspecified: Secondary | ICD-10-CM | POA: Diagnosis not present

## 2023-11-14 DIAGNOSIS — Z6831 Body mass index (BMI) 31.0-31.9, adult: Secondary | ICD-10-CM | POA: Diagnosis not present

## 2023-11-14 DIAGNOSIS — Z1211 Encounter for screening for malignant neoplasm of colon: Secondary | ICD-10-CM | POA: Diagnosis not present

## 2023-11-14 DIAGNOSIS — E7800 Pure hypercholesterolemia, unspecified: Secondary | ICD-10-CM | POA: Diagnosis not present

## 2023-11-14 DIAGNOSIS — Z0000 Encounter for general adult medical examination without abnormal findings: Secondary | ICD-10-CM | POA: Diagnosis not present

## 2023-11-14 DIAGNOSIS — I10 Essential (primary) hypertension: Secondary | ICD-10-CM | POA: Diagnosis not present

## 2023-11-14 DIAGNOSIS — Z1231 Encounter for screening mammogram for malignant neoplasm of breast: Secondary | ICD-10-CM | POA: Diagnosis not present

## 2023-11-14 DIAGNOSIS — Z124 Encounter for screening for malignant neoplasm of cervix: Secondary | ICD-10-CM | POA: Diagnosis not present

## 2023-11-28 DIAGNOSIS — Z6831 Body mass index (BMI) 31.0-31.9, adult: Secondary | ICD-10-CM | POA: Diagnosis not present

## 2023-11-28 DIAGNOSIS — R300 Dysuria: Secondary | ICD-10-CM | POA: Diagnosis not present

## 2023-11-28 DIAGNOSIS — R42 Dizziness and giddiness: Secondary | ICD-10-CM | POA: Diagnosis not present

## 2023-11-28 DIAGNOSIS — I10 Essential (primary) hypertension: Secondary | ICD-10-CM | POA: Diagnosis not present

## 2023-11-30 ENCOUNTER — Other Ambulatory Visit: Payer: Self-pay | Admitting: Physician Assistant

## 2023-11-30 DIAGNOSIS — R42 Dizziness and giddiness: Secondary | ICD-10-CM

## 2023-12-01 ENCOUNTER — Other Ambulatory Visit: Payer: Self-pay | Admitting: Physician Assistant

## 2023-12-01 DIAGNOSIS — R42 Dizziness and giddiness: Secondary | ICD-10-CM

## 2023-12-26 ENCOUNTER — Ambulatory Visit (INDEPENDENT_AMBULATORY_CARE_PROVIDER_SITE_OTHER)

## 2023-12-26 ENCOUNTER — Other Ambulatory Visit

## 2023-12-26 DIAGNOSIS — R42 Dizziness and giddiness: Secondary | ICD-10-CM

## 2023-12-28 DIAGNOSIS — R300 Dysuria: Secondary | ICD-10-CM | POA: Diagnosis not present

## 2023-12-28 DIAGNOSIS — I10 Essential (primary) hypertension: Secondary | ICD-10-CM | POA: Diagnosis not present

## 2023-12-28 DIAGNOSIS — D333 Benign neoplasm of cranial nerves: Secondary | ICD-10-CM | POA: Diagnosis not present

## 2023-12-28 DIAGNOSIS — Z6830 Body mass index (BMI) 30.0-30.9, adult: Secondary | ICD-10-CM | POA: Diagnosis not present

## 2024-02-19 DIAGNOSIS — L304 Erythema intertrigo: Secondary | ICD-10-CM | POA: Diagnosis not present

## 2024-02-19 DIAGNOSIS — L821 Other seborrheic keratosis: Secondary | ICD-10-CM | POA: Diagnosis not present

## 2024-02-19 DIAGNOSIS — Z85828 Personal history of other malignant neoplasm of skin: Secondary | ICD-10-CM | POA: Diagnosis not present

## 2024-02-19 DIAGNOSIS — C44311 Basal cell carcinoma of skin of nose: Secondary | ICD-10-CM | POA: Diagnosis not present

## 2024-02-19 DIAGNOSIS — Z129 Encounter for screening for malignant neoplasm, site unspecified: Secondary | ICD-10-CM | POA: Diagnosis not present

## 2024-02-19 DIAGNOSIS — D485 Neoplasm of uncertain behavior of skin: Secondary | ICD-10-CM | POA: Diagnosis not present

## 2024-02-19 DIAGNOSIS — L570 Actinic keratosis: Secondary | ICD-10-CM | POA: Diagnosis not present

## 2024-02-23 DIAGNOSIS — I10 Essential (primary) hypertension: Secondary | ICD-10-CM | POA: Diagnosis not present

## 2024-02-23 DIAGNOSIS — E119 Type 2 diabetes mellitus without complications: Secondary | ICD-10-CM | POA: Diagnosis not present

## 2024-02-23 DIAGNOSIS — Z6830 Body mass index (BMI) 30.0-30.9, adult: Secondary | ICD-10-CM | POA: Diagnosis not present

## 2024-02-23 DIAGNOSIS — E785 Hyperlipidemia, unspecified: Secondary | ICD-10-CM | POA: Diagnosis not present

## 2024-02-23 DIAGNOSIS — R3129 Other microscopic hematuria: Secondary | ICD-10-CM | POA: Diagnosis not present

## 2024-03-19 ENCOUNTER — Ambulatory Visit: Admitting: Diagnostic Neuroimaging

## 2024-05-01 DIAGNOSIS — C44311 Basal cell carcinoma of skin of nose: Secondary | ICD-10-CM | POA: Diagnosis not present

## 2024-05-24 DIAGNOSIS — R202 Paresthesia of skin: Secondary | ICD-10-CM | POA: Diagnosis not present

## 2024-05-24 DIAGNOSIS — G2581 Restless legs syndrome: Secondary | ICD-10-CM | POA: Diagnosis not present

## 2024-05-24 DIAGNOSIS — R4189 Other symptoms and signs involving cognitive functions and awareness: Secondary | ICD-10-CM | POA: Diagnosis not present

## 2024-05-24 DIAGNOSIS — E119 Type 2 diabetes mellitus without complications: Secondary | ICD-10-CM | POA: Diagnosis not present

## 2024-07-17 ENCOUNTER — Telehealth: Payer: Self-pay | Admitting: Diagnostic Neuroimaging

## 2024-07-17 NOTE — Telephone Encounter (Signed)
 Patient LVM at 10:20 am to cancel appointment.

## 2024-07-24 ENCOUNTER — Ambulatory Visit: Admitting: Diagnostic Neuroimaging
